# Patient Record
Sex: Female | Born: 1965 | Race: Black or African American | Hispanic: No | Marital: Single | Smoking: Current every day smoker
Health system: Southern US, Community
[De-identification: ages and names within clinical notes are randomized; demographics above are authoritative.]

## PROBLEM LIST (undated history)

## (undated) DIAGNOSIS — I10 Essential (primary) hypertension: Secondary | ICD-10-CM

---

## 2009-10-30 ENCOUNTER — Emergency Department: Payer: Self-pay | Admitting: Emergency Medicine

## 2010-02-04 ENCOUNTER — Emergency Department: Payer: Self-pay | Admitting: Internal Medicine

## 2010-04-29 ENCOUNTER — Emergency Department: Payer: Self-pay | Admitting: Internal Medicine

## 2010-05-06 ENCOUNTER — Emergency Department: Payer: Self-pay | Admitting: Internal Medicine

## 2010-06-19 ENCOUNTER — Emergency Department: Payer: Self-pay | Admitting: Emergency Medicine

## 2011-08-05 ENCOUNTER — Emergency Department: Payer: Self-pay | Admitting: Internal Medicine

## 2012-05-19 ENCOUNTER — Emergency Department: Payer: Self-pay | Admitting: *Deleted

## 2012-05-19 LAB — URINALYSIS, COMPLETE
Glucose,UR: NEGATIVE mg/dL (ref 0–75)
Nitrite: NEGATIVE
Protein: NEGATIVE
Specific Gravity: 1.027 (ref 1.003–1.030)
Squamous Epithelial: 12
WBC UR: 1 /HPF (ref 0–5)

## 2012-05-19 LAB — WET PREP, GENITAL

## 2012-05-19 LAB — PREGNANCY, URINE: Pregnancy Test, Urine: NEGATIVE m[IU]/mL

## 2013-02-17 ENCOUNTER — Emergency Department: Payer: Self-pay | Admitting: Emergency Medicine

## 2014-01-20 ENCOUNTER — Emergency Department: Payer: Self-pay | Admitting: Emergency Medicine

## 2014-09-10 ENCOUNTER — Emergency Department: Payer: Self-pay | Admitting: Emergency Medicine

## 2014-11-22 ENCOUNTER — Emergency Department: Payer: Self-pay | Admitting: Internal Medicine

## 2015-02-19 ENCOUNTER — Emergency Department: Payer: Self-pay | Admitting: Student

## 2015-12-12 ENCOUNTER — Emergency Department: Payer: Self-pay

## 2015-12-12 ENCOUNTER — Emergency Department
Admission: EM | Admit: 2015-12-12 | Discharge: 2015-12-12 | Disposition: A | Discharge status: Home / Self Care | Payer: Self-pay | Attending: Emergency Medicine | Admitting: Emergency Medicine

## 2015-12-12 ENCOUNTER — Encounter: Payer: Self-pay | Admitting: Emergency Medicine

## 2015-12-12 DIAGNOSIS — W11XXXA Fall on and from ladder, initial encounter: Secondary | ICD-10-CM | POA: Insufficient documentation

## 2015-12-12 DIAGNOSIS — Y9389 Activity, other specified: Secondary | ICD-10-CM | POA: Insufficient documentation

## 2015-12-12 DIAGNOSIS — M545 Low back pain, unspecified: Secondary | ICD-10-CM

## 2015-12-12 DIAGNOSIS — Y9289 Other specified places as the place of occurrence of the external cause: Secondary | ICD-10-CM | POA: Insufficient documentation

## 2015-12-12 DIAGNOSIS — S3992XA Unspecified injury of lower back, initial encounter: Secondary | ICD-10-CM | POA: Insufficient documentation

## 2015-12-12 DIAGNOSIS — S3991XA Unspecified injury of abdomen, initial encounter: Secondary | ICD-10-CM | POA: Insufficient documentation

## 2015-12-12 DIAGNOSIS — S37019A Minor contusion of unspecified kidney, initial encounter: Secondary | ICD-10-CM | POA: Insufficient documentation

## 2015-12-12 DIAGNOSIS — Y998 Other external cause status: Secondary | ICD-10-CM | POA: Insufficient documentation

## 2015-12-12 LAB — URINALYSIS COMPLETE WITH MICROSCOPIC (ARMC ONLY)
Bilirubin Urine: NEGATIVE
Glucose, UA: NEGATIVE mg/dL
Hgb urine dipstick: NEGATIVE
KETONES UR: NEGATIVE mg/dL
NITRITE: NEGATIVE
Protein, ur: 30 mg/dL — AB
SPECIFIC GRAVITY, URINE: 1.023 (ref 1.005–1.030)
pH: 7 (ref 5.0–8.0)

## 2015-12-12 MED ORDER — OXYCODONE-ACETAMINOPHEN 5-325 MG PO TABS
1.0000 | ORAL_TABLET | Freq: Once | ORAL | Status: AC
  Administered 2015-12-12: 1 via ORAL
  Filled 2015-12-12: qty 1

## 2015-12-12 MED ORDER — MELOXICAM 15 MG PO TABS: 15.0000 mg | ORAL_TABLET | Freq: Every day | ORAL | Status: DC

## 2015-12-12 NOTE — ED Notes (Signed)
Patient presents to the ED with lower back pain [facility]  Patient states she was standing on a small ladder and her grandchild pulled her down.  Patient ambulatory to registration desk.  Patient reports vaginal spotting 1 x this morning but denies any more vaginal bleeding today.

## 2015-12-12 NOTE — ED Provider Notes (Signed)
[facility] Emergency Department Provider Note  ____________________________________________  Time seen: Approximately 3:13 PM  I have reviewed the triage vital signs and the nursing notes.   HISTORY  Chief Complaint Back Pain    HPI Tracey Fisher is a 49 y.o. female who presents emergency department complaining of severe lower back pain. She states that she was on a step stool yesterday when her grandchild accidentally pulled on her causing her to fall. She states that she fell landing on her buttocks and lower back. She states she initially had significant pain to area but "walked it off." She states this morning she woke up and had increase in her pain. She also endorses significant hematuria this morning. Patient states that she is not having radicular symptoms, did not hit her head or lose consciousness, no chest pain or shortness of breath. States that her hematuria was enough to turn the water red.     History reviewed. No pertinent past medical history.  There are no active problems to display for this patient.   History reviewed. No pertinent past surgical history.  Current Outpatient Rx  Name  Route  Sig  Dispense  Refill  . meloxicam (MOBIC) 15 MG tablet   Oral   Take 1 tablet (15 mg total) by mouth daily.   30 tablet   0     Allergies Review of patient's allergies indicates not on file.  No family history on file.  Social History Social History  Substance Use Topics  . Smoking status: None  . Smokeless tobacco: None  . Alcohol Use: None    Review of Systems Constitutional: No fever/chills Eyes: No visual changes. ENT: No sore throat. Cardiovascular: Denies chest pain. Respiratory: Denies shortness of breath. Gastrointestinal: No abdominal pain.  No nausea, no vomiting.  No diarrhea.  No constipation. Genitourinary: Negative for dysuria.  endorses hematuria.  Musculoskeletal: Negative for back pain. endorses lower back  pain.  Skin: Negative for rash. Neurological: Negative for headaches, focal weakness or numbness.  10-point ROS otherwise negative.  ____________________________________________   PHYSICAL EXAM:  VITAL SIGNS: ED Triage Vitals  Enc Vitals Group     BP 12/12/15 1417 135/94 mmHg     Pulse Rate 12/12/15 1417 85     Resp 12/12/15 1417 20     Temp 12/12/15 1417 98.3 F (36.8 C)     Temp Source 12/12/15 1417 Oral     SpO2 12/12/15 1417 100 %     Weight 12/12/15 1417 125 lb (56.7 kg)     Height 12/12/15 1417  (1.753 m)     Head Cir --      Peak Flow --      Pain Score 12/12/15 1417 9     Pain Loc --      Pain Edu? --      Excl. in GC? --     Constitutional: Alert and oriented. Well appearing and in no acute distress. Eyes: Conjunctivae are normal. PERRL. EOMI. Head: Atraumatic. Nose: No congestion/rhinnorhea. Mouth/Throat: Mucous membranes are moist.  Oropharynx non-erythematous. Neck: No stridor.  No cervical spine tenderness to palpation. Cardiovascular: Normal rate, regular rhythm. Grossly normal heart sounds.  Good peripheral circulation. Respiratory: Normal respiratory effort.  No retractions. Lungs CTAB. Gastrointestinal: Soft and nontender. No distention. No abdominal bruits. Positive CVA tenderness. Musculoskeletal: No lower extremity tenderness nor edema.  No joint effusions. No visible deformity to the spine upon inspection. There is no ecchymosis or contusion noted. Patient is  extremely tender to palpation over midline spinal processes in the lumbar region. No palpable abnormality. Neurologic:  Normal speech and language. No gross focal neurologic deficits are appreciated. No gait instability. Skin:  Skin is warm, dry and intact. No rash noted. Psychiatric: Mood and affect are normal. Speech and behavior are normal.  ____________________________________________   LABS (all labs ordered are listed, but only abnormal results are displayed)  Labs Reviewed   URINALYSIS COMPLETEWITH MICROSCOPIC ([facility] ONLY) - Abnormal; Notable for the following:    Color, Urine YELLOW (*)    APPearance TURBID (*)    Protein, ur 30 (*)    Leukocytes, UA TRACE (*)    Bacteria, UA RARE (*)    Squamous Epithelial / LPF 6-30 (*)    All other components within normal limits   ____________________________________________  EKG   ____________________________________________  RADIOLOGY  Lumbar spine x-ray Impression: Mild degenerative change within the mid lower lumbar spine. Stable compared to previous exam. No acute findings.   I personally reviewed the images. ____________________________________________   PROCEDURES  Procedure(s) performed: None  Critical Care performed: No  ____________________________________________   INITIAL IMPRESSION / ASSESSMENT AND PLAN / ED COURSE  Pertinent labs & imaging results that were available during my care of the patient were reviewed by me and considered in my medical decision making (see chart for details).  Patient's diagnosis consistent with lumbago secondary to a fall. No sciatica. Patient also had some reported hematuria. Urinalysis returns with no signs of red blood cells. Patient likely had a mild renal contusion. Patient will be discharged home with strict ED precautions to return for worsening of symptoms. Otherwise patient will follow-up with primary care. Patient verbalizes understanding of her diagnosis and treatment plan and verbalizes compliance with same.   New Prescriptions   MELOXICAM (MOBIC) 15 MG TABLET    Take 1 tablet (15 mg total) by mouth daily.    ____________________________________________   FINAL CLINICAL IMPRESSION(S) / ED DIAGNOSES  Final diagnoses:  Acute midline low back pain without sciatica  Renal contusion, unspecified laterality, initial encounter      Racheal PatchesJonathan D Hutch Rhett, PA-C 12/12/15 1840  Arnaldo NatalPaul F Malinda, MD 12/12/15 2325

## 2015-12-12 NOTE — Discharge Instructions (Signed)
Back Exercises °The following exercises strengthen the muscles that help to support the back. They also help to keep the lower back flexible. Doing these exercises can help to prevent back pain or lessen existing pain. °If you have back pain or discomfort, try doing these exercises 2-3 times each day or as told by your health care provider. When the pain goes away, do them once each day, but increase the number of times that you repeat the steps for each exercise (do more repetitions). If you do not have back pain or discomfort, do these exercises once each day or as told by your health care provider. °EXERCISES °Single Knee to Chest °Repeat these steps 3-5 times for each leg: °· Lie on your back on a firm bed or the floor with your legs extended. °· Bring one knee to your chest. Your other leg should stay extended and in contact with the floor. °· Hold your knee in place by grabbing your knee or thigh. °· Pull on your knee until you feel a gentle stretch in your lower back. °· Hold the stretch for 10-30 seconds. °· Slowly release and straighten your leg. °Pelvic Tilt °Repeat these steps 5-10 times: °· Lie on your back on a firm bed or the floor with your legs extended. °· Bend your knees so they are pointing toward the ceiling and your feet are flat on the floor. °· Tighten your lower abdominal muscles to press your lower back against the floor. This motion will tilt your pelvis so your tailbone points up toward the ceiling instead of pointing to your feet or the floor. °· With gentle tension and even breathing, hold this position for 5-10 seconds. °Cat-Cow °Repeat these steps until your lower back becomes more flexible: °· Get into a hands-and-knees position on a firm surface. Keep your hands under your shoulders, and keep your knees under your hips. You may place padding under your knees for comfort. °· Let your head hang down, and point your tailbone toward the floor so your lower back becomes rounded like the  back of a cat. °· Hold this position for 5 seconds. °· Slowly lift your head and point your tailbone up toward the ceiling so your back forms a sagging arch like the back of a cow. °· Hold this position for 5 seconds. °Press-Ups °Repeat these steps 5-10 times: °· Lie on your abdomen (face-down) on the floor. °· Place your palms near your head, about shoulder-width apart. °· While you keep your back as relaxed as possible and keep your hips on the floor, slowly straighten your arms to raise the top half of your body and lift your shoulders. Do not use your back muscles to raise your upper torso. You may adjust the placement of your hands to make yourself more comfortable. °· Hold this position for 5 seconds while you keep your back relaxed. °· Slowly return to lying flat on the floor. °Bridges °Repeat these steps 10 times: °1. Lie on your back on a firm surface. °2. Bend your knees so they are pointing toward the ceiling and your feet are flat on the floor. °3. Tighten your buttocks muscles and lift your buttocks off of the floor until your waist is at almost the same height as your knees. You should feel the muscles working in your buttocks and the back of your thighs. If you do not feel these muscles, slide your feet 1-2 inches farther away from your buttocks. °4. Hold this position for 3-5   seconds. °5. Slowly lower your hips to the starting position, and allow your buttocks muscles to relax completely. °If this exercise is too easy, try doing it with your arms crossed over your chest. °Abdominal Crunches °Repeat these steps 5-10 times: °1. Lie on your back on a firm bed or the floor with your legs extended. °2. Bend your knees so they are pointing toward the ceiling and your feet are flat on the floor. °3. Cross your arms over your chest. °4. Tip your chin slightly toward your chest without bending your neck. °5. Tighten your abdominal muscles and slowly raise your trunk (torso) high enough to lift your shoulder  blades a tiny bit off of the floor. Avoid raising your torso higher than that, because it can put too much stress on your low back and it does not help to strengthen your abdominal muscles. °6. Slowly return to your starting position. °Back Lifts °Repeat these steps 5-10 times: °1. Lie on your abdomen (face-down) with your arms at your sides, and rest your forehead on the floor. °2. Tighten the muscles in your legs and your buttocks. °3. Slowly lift your chest off of the floor while you keep your hips pressed to the floor. Keep the back of your head in line with the curve in your back. Your eyes should be looking at the floor. °4. Hold this position for 3-5 seconds. °5. Slowly return to your starting position. °SEEK MEDICAL CARE IF: °· Your back pain or discomfort gets much worse when you do an exercise. °· Your back pain or discomfort does not lessen within 2 hours after you exercise. °If you have any of these problems, stop doing these exercises right away. Do not do them again unless your health care provider says that you can. °SEEK IMMEDIATE MEDICAL CARE IF: °· You develop sudden, severe back pain. If this happens, stop doing the exercises right away. Do not do them again unless your health care provider says that you can. °  °This information is not intended to replace advice given to you by your health care provider. Make sure you discuss any questions you have with your health care provider. °  °Document Released: 01/18/2005 Document Revised: 09/01/2015 Document Reviewed: 02/04/2015 °Elsevier Interactive Patient Education ©2016 [employer] ° °Back Pain, Adult °Back pain is very common in adults. The cause of back pain is rarely dangerous and the pain often gets better over time. The cause of your back pain may not be known. Some common causes of back pain include: °· Strain of the muscles or ligaments supporting the spine. °· Wear and tear (degeneration) of the spinal disks. °· Arthritis. °· Direct injury  to the back. °For many people, back pain may return. Since back pain is rarely dangerous, most people can learn to manage this condition on their own. °HOME CARE INSTRUCTIONS °Watch your back pain for any changes. The following actions may help to lessen any discomfort you are feeling: °· Remain active. It is stressful on your back to sit or stand in one place for long periods of time. Do not sit, drive, or stand in one place for more than 30 minutes at a time. Take short walks on even surfaces as soon as you are able. Try to increase the length of time you walk each day. °· Exercise regularly as directed by your health care provider. Exercise helps your back heal faster. It also helps avoid future injury by keeping your muscles strong and flexible. °· Do not stay in   bed. Resting more than 1-2 days can delay your recovery. °· Pay attention to your body when you bend and lift. The most comfortable positions are those that put less stress on your recovering back. Always use proper lifting techniques, including: °¨ Bending your knees. °¨ Keeping the load close to your body. °¨ Avoiding twisting. °· Find a comfortable position to sleep. Use a firm mattress and lie on your side with your knees slightly bent. If you lie on your back, put a pillow under your knees. °· Avoid feeling anxious or stressed. Stress increases muscle tension and can worsen back pain. It is important to recognize when you are anxious or stressed and learn ways to manage it, such as with exercise. °· Take medicines only as directed by your health care provider. Over-the-counter medicines to reduce pain and inflammation are often the most helpful. Your health care provider may prescribe muscle relaxant drugs. These medicines help dull your pain so you can more quickly return to your normal activities and healthy exercise. °· Apply ice to the injured area: °¨ Put ice in a plastic bag. °¨ Place a towel between your skin and the bag. °¨ Leave the ice on  for 20 minutes, 2-3 times a day for the first 2-3 days. After that, ice and heat may be alternated to reduce pain and spasms. °· Maintain a healthy weight. Excess weight puts extra stress on your back and makes it difficult to maintain good posture. °SEEK MEDICAL CARE IF: °· You have pain that is not relieved with rest or medicine. °· You have increasing pain going down into the legs or buttocks. °· You have pain that does not improve in one week. °· You have night pain. °· You lose weight. °· You have a fever or chills. °SEEK IMMEDIATE MEDICAL CARE IF:  °· You develop new bowel or bladder control problems. °· You have unusual weakness or numbness in your arms or legs. °· You develop nausea or vomiting. °· You develop abdominal pain. °· You feel faint. °  °This information is not intended to replace advice given to you by your health care provider. Make sure you discuss any questions you have with your health care provider. °  °Document Released: 12/11/2005 Document Revised: 01/01/2015 Document Reviewed: 04/14/2014 °Elsevier Interactive Patient Education ©2016 [employer] ° °

## 2015-12-12 NOTE — ED Notes (Signed)
Pt discharged home after verbalizing understanding of discharge instructions; nad noted. 

## 2015-12-12 NOTE — ED Notes (Signed)
Pt reports that she fell yesterday and hurt her back. She said it didn't really hurt until this morning, and she woke up in pain. She also had enough blood in her urine to turn the water red, but has not had any then. Pt alert & oriented with NAD noted.

## 2016-06-14 ENCOUNTER — Emergency Department
Admission: EM | Admit: 2016-06-14 | Discharge: 2016-06-14 | Disposition: A | Discharge status: Home / Self Care | Payer: Self-pay | Attending: Emergency Medicine | Admitting: Emergency Medicine

## 2016-06-14 ENCOUNTER — Encounter: Payer: Self-pay | Admitting: Emergency Medicine

## 2016-06-14 DIAGNOSIS — Z791 Long term (current) use of non-steroidal anti-inflammatories (NSAID): Secondary | ICD-10-CM | POA: Insufficient documentation

## 2016-06-14 DIAGNOSIS — F17200 Nicotine dependence, unspecified, uncomplicated: Secondary | ICD-10-CM | POA: Insufficient documentation

## 2016-06-14 DIAGNOSIS — N3001 Acute cystitis with hematuria: Secondary | ICD-10-CM | POA: Insufficient documentation

## 2016-06-14 LAB — URINALYSIS COMPLETE WITH MICROSCOPIC (ARMC ONLY)
BILIRUBIN URINE: NEGATIVE
Glucose, UA: NEGATIVE mg/dL
Nitrite: NEGATIVE
PH: 6 (ref 5.0–8.0)
Protein, ur: NEGATIVE mg/dL
SPECIFIC GRAVITY, URINE: 1.017 (ref 1.005–1.030)

## 2016-06-14 LAB — POCT PREGNANCY, URINE: Preg Test, Ur: NEGATIVE

## 2016-06-14 MED ORDER — PHENAZOPYRIDINE HCL 200 MG PO TABS: 200.0000 mg | ORAL_TABLET | Freq: Three times a day (TID) | ORAL | Status: AC | PRN

## 2016-06-14 MED ORDER — SULFAMETHOXAZOLE-TRIMETHOPRIM 800-160 MG PO TABS: 1.0000 | ORAL_TABLET | Freq: Two times a day (BID) | ORAL | Status: DC

## 2016-06-14 NOTE — ED Notes (Signed)
NAD noted at time of D/C. Pt denies questions or concerns. Pt ambulatory to the lobby at this time.  

## 2016-06-14 NOTE — Discharge Instructions (Signed)

## 2016-06-14 NOTE — ED Notes (Signed)
Pt to ed with c/o bloody urine and back pain x 2 days.

## 2016-06-14 NOTE — ED Provider Notes (Signed)
[facility] Emergency Department Provider Note  ____________________________________________  Time seen: Approximately 11:07 AM  I have reviewed the triage vital signs and the nursing notes.   HISTORY  Chief Complaint Hematuria and Back Pain    HPI Tracey Fisher is a 50 y.o. female who arrives to the emergency department with complaint of "pulling sensation" when peeing x 1-2 days and hematuria. Says suprapubic pulling sensation only occurs during urination and with radiating right mid-back and flank pain. States pain is an 8-9/10 and has not been treated with any pharmacologic or non-pharmacologic management. Patients states that when peeing she noticed a trace amount of blood in her urine. She admits to subjective fevers and night sweats last night, with increased frequency of urination, hematuria, and dark, cloudy, malodorous urine. She denies history of nephrolithiasis, vaginal discharge, vaginal bleeding, nausea, vomiting, and diarrhea.   History reviewed. No pertinent past medical history.  There are no active problems to display for this patient.   History reviewed. No pertinent past surgical history.  Current Outpatient Rx  Name  Route  Sig  Dispense  Refill  . meloxicam (MOBIC) 15 MG tablet   Oral   Take 1 tablet (15 mg total) by mouth daily.   30 tablet   0   . phenazopyridine (PYRIDIUM) 200 MG tablet   Oral   Take 1 tablet (200 mg total) by mouth 3 (three) times daily as needed for pain.   20 tablet   0   . sulfamethoxazole-trimethoprim (BACTRIM DS,SEPTRA DS) 800-160 MG tablet   Oral   Take 1 tablet by mouth 2 (two) times daily.   6 tablet   0     Allergies Naproxen and Tramadol  History reviewed. No pertinent family history.  Social History Social History  Substance Use Topics  . Smoking status: Current Every Day Smoker  . Smokeless tobacco: None  . Alcohol Use: No    Review of Systems Constitutional: Negative for  fever. Respiratory: Negative for shortness of breath or cough. Gastrointestinal: Positive for abdominal pain; negative for nausea , negative for vomiting. Genitourinary: Positive for dysuria , negative for vaginal discharge. Musculoskeletal: Positive for back pain. Skin: Negative for wound, rash, or lesion. ____________________________________________   PHYSICAL EXAM:  VITAL SIGNS: ED Triage Vitals  Enc Vitals Group     BP 06/14/16 1047 118/82 mmHg     Pulse Rate 06/14/16 1047 95     Resp 06/14/16 1047 20     Temp 06/14/16 1047 98.7 F (37.1 C)     Temp Source 06/14/16 1047 Oral     SpO2 06/14/16 1047 100 %     Weight 06/14/16 1047 125 lb (56.7 kg)     Height 06/14/16 1047  (1.448 m)     Head Cir --      Peak Flow --      Pain Score 06/14/16 1048 8     Pain Loc --      Pain Edu? --      Excl. in GC? --     Constitutional: Alert and oriented. Well appearing and in no acute distress. Eyes: Conjunctivae are normal. PERRL. EOMI. Head: Atraumatic. Nose: No congestion/rhinnorhea. Mouth/Throat: Mucous membranes are moist. Respiratory: Normal respiratory effort.  No retractions. Gastrointestinal: Suprapubic tenderness on palpation, otherwise soft and non-tender without distension, rigidity, or rebound tenderness. Genitourinary: Pelvic exam: deferred Musculoskeletal: No extremity tenderness nor edema. Mild CVA tenderness on the right. Neurologic:  Normal speech and language. No gross focal  neurologic deficits are appreciated. Speech is normal. No gait instability. Skin:  Skin is warm, dry and intact. No rash noted. Psychiatric: Mood and affect are normal. Speech and behavior are normal.  ____________________________________________   LABS (all labs ordered are listed, but only abnormal results are displayed)  Labs Reviewed  URINALYSIS COMPLETEWITH MICROSCOPIC ([facility] ONLY) - Abnormal; Notable for the following:    Color, Urine YELLOW (*)    APPearance HAZY (*)     Ketones, ur TRACE (*)    Hgb urine dipstick 2+ (*)    Leukocytes, UA TRACE (*)    Bacteria, UA RARE (*)    Squamous Epithelial / LPF 6-30 (*)    All other components within normal limits  POC URINE PREG, ED  POCT PREGNANCY, URINE   ____________________________________________  RADIOLOGY  Not indicated. ____________________________________________   PROCEDURES  Procedure(s) performed: None  ____________________________________________   INITIAL IMPRESSION / ASSESSMENT AND PLAN / ED COURSE  Pertinent labs & imaging results that were available during my care of the patient were reviewed by me and considered in my medical decision making (see chart for details).  Differential diagnosis could be nephrolithiasis, however at this time clinical exam makes it less likely. Will treat with 3 days of antibiotic therapy.  Patient will be given prescriptions for Bactrim and pyridium today. She was advised to follow up with the health department for symptoms that are not improving over the next few days. She was advised to return to the ER for symptoms that change or worsen if unable to schedule an appointment. ____________________________________________   FINAL CLINICAL IMPRESSION(S) / ED DIAGNOSES  Final diagnoses:  Acute cystitis with hematuria    Note:  This document was prepared using Dragon voice recognition software and may include unintentional dictation errors.   Chinita PesterCari B Bryon Parker, FNP 06/14/16 1245  Minna AntisKevin Paduchowski, MD 06/15/16 2006

## 2016-09-14 ENCOUNTER — Emergency Department
Admission: EM | Admit: 2016-09-14 | Discharge: 2016-09-14 | Disposition: A | Discharge status: Home / Self Care | Payer: Self-pay | Attending: Emergency Medicine | Admitting: Emergency Medicine

## 2016-09-14 DIAGNOSIS — L729 Follicular cyst of the skin and subcutaneous tissue, unspecified: Secondary | ICD-10-CM | POA: Insufficient documentation

## 2016-09-14 DIAGNOSIS — F17200 Nicotine dependence, unspecified, uncomplicated: Secondary | ICD-10-CM | POA: Insufficient documentation

## 2016-09-14 DIAGNOSIS — L089 Local infection of the skin and subcutaneous tissue, unspecified: Secondary | ICD-10-CM

## 2016-09-14 DIAGNOSIS — Z79899 Other long term (current) drug therapy: Secondary | ICD-10-CM | POA: Insufficient documentation

## 2016-09-14 MED ORDER — CEPHALEXIN 500 MG PO CAPS: 500.0000 mg | ORAL_CAPSULE | Freq: Four times a day (QID) | ORAL | 0 refills | Status: DC
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
## 2016-09-14 NOTE — ED Triage Notes (Signed)
Pt presents to ED with c/o a boil on her chest for "years". Pt reports she "can smell it", but denies any drainage from the site. Pt denies any N/V/D or fever; denies SHOB or CP.

## 2016-09-14 NOTE — ED Notes (Signed)
Pt in via triage with complaints of recurrent cyst to left chest x "years."  Pt reports new pain x 2 weeks with an odor coming from cyst, pt denies any discharge.  Pt A/Ox4, no immediate distress noted.

## 2017-02-05 ENCOUNTER — Encounter: Payer: Self-pay | Admitting: Emergency Medicine

## 2017-02-05 ENCOUNTER — Emergency Department
Admission: EM | Admit: 2017-02-05 | Discharge: 2017-02-05 | Disposition: A | Discharge status: Home / Self Care | Payer: Self-pay | Attending: Emergency Medicine | Admitting: Emergency Medicine

## 2017-02-05 DIAGNOSIS — S61012A Laceration without foreign body of left thumb without damage to nail, initial encounter: Secondary | ICD-10-CM | POA: Insufficient documentation

## 2017-02-05 DIAGNOSIS — Y939 Activity, unspecified: Secondary | ICD-10-CM | POA: Insufficient documentation

## 2017-02-05 DIAGNOSIS — W268XXA Contact with other sharp object(s), not elsewhere classified, initial encounter: Secondary | ICD-10-CM | POA: Insufficient documentation

## 2017-02-05 DIAGNOSIS — Y999 Unspecified external cause status: Secondary | ICD-10-CM | POA: Insufficient documentation

## 2017-02-05 DIAGNOSIS — Y929 Unspecified place or not applicable: Secondary | ICD-10-CM | POA: Insufficient documentation

## 2017-02-05 DIAGNOSIS — F17210 Nicotine dependence, cigarettes, uncomplicated: Secondary | ICD-10-CM | POA: Insufficient documentation

## 2017-02-05 NOTE — ED Triage Notes (Signed)
Cut thumb L hand on porcelain yesterday.

## 2017-02-05 NOTE — Discharge Instructions (Signed)
Keep the wound clean, dry, and covered as needed. Avoid using creams, oils, ointment, or lotions to the glued wound.

## 2017-02-05 NOTE — ED Provider Notes (Signed)
[facility] Emergency Department Provider Note ____________________________________________  Time seen: 1325  I have reviewed the triage vital signs and the nursing notes.  HISTORY  Chief Complaint  Laceration  HPI Tracey Fisher is a 51 y.o. female presents to the ED for evaluation of a laceration to her left thumb. She reports she accidentally cut her thumb on a porcelain figurine yesterday afternoon. She initially dressed the thumb and was content. She reports now due to increased bleeding and the wound opening up at the joint crease.   History reviewed. No pertinent past medical history.  There are no active problems to display for this patient.  History reviewed. No pertinent surgical history.  Prior to Admission medications   Medication Sig Start Date End Date Taking? Authorizing Provider  cephALEXin (KEFLEX) 500 MG capsule Take 1 capsule (500 mg total) by mouth 4 (four) times daily. 09/14/16   Delorise RoyalsJonathan D Cuthriell, PA-C  meloxicam (MOBIC) 15 MG tablet Take 1 tablet (15 mg total) by mouth daily. 12/12/15   Delorise RoyalsJonathan D Cuthriell, PA-C  metroNIDAZOLE (FLAGYL) 500 MG tablet Take 500 mg by mouth 2 (two) times daily.    Historical Provider, MD  phenazopyridine (PYRIDIUM) 200 MG tablet Take 1 tablet (200 mg total) by mouth 3 (three) times daily as needed for pain. 06/14/16 06/14/17  Chinita Pesterari B Triplett, FNP  sulfamethoxazole-trimethoprim (BACTRIM DS,SEPTRA DS) 800-160 MG tablet Take 1 tablet by mouth 2 (two) times daily. 06/14/16   Chinita Pesterari B Triplett, FNP    Allergies Naproxen and Tramadol  No family history on file.  Social History Social History  Substance Use Topics  . Smoking status: Current Every Day Smoker    Packs/day: 0.10    Types: Cigarettes  . Smokeless tobacco: Never Used  . Alcohol use No    Review of Systems  Constitutional: Negative for fever. Cardiovascular: Negative for chest pain. Respiratory: Negative for shortness of  breath. Musculoskeletal: Negative for back pain. Skin: Negative for rash. Left thumb lac as above.  Neurological: Negative for headaches, focal weakness or numbness. ____________________________________________  PHYSICAL EXAM:  VITAL SIGNS: ED Triage Vitals  Enc Vitals Group     BP 02/05/17 1255 (!) 149/86     Pulse Rate 02/05/17 1255 (!) 57     Resp 02/05/17 1255 18     Temp 02/05/17 1255 98.2 F (36.8 C)     Temp Source 02/05/17 1255 Oral     SpO2 02/05/17 1255 100 %     Weight 02/05/17 1256 135 lb (61.2 kg)     Height 02/05/17 1256 4\' 9"  (1.448 m)     Head Circumference --      Peak Flow --      Pain Score 02/05/17 1256 8     Pain Loc --      Pain Edu? --      Excl. in GC? --    Constitutional: Alert and oriented. Well appearing and in no distress. Head: Normocephalic and atraumatic. Cardiovascular: Normal rate, regular rhythm. Normal distal pulses. Respiratory: Normal respiratory effort.  Musculoskeletal: Normal composite fist to the left hand. Nontender with normal range of motion in all extremities.  Neurologic:  Normal gross sensation. Normal intrinsic and opposition testing. No gross focal neurologic deficits are appreciated. Skin:  Skin is warm, dry and intact. Left thumb with a superficial, linear laceration across the DIP flexor surface.  ____________________________________________  PROCEDURES  LACERATION REPAIR Performed by: Lissa HoardMenshew, Crixus Mcaulay V Bacon Authorized by: Lissa HoardMenshew, Iysha Mishkin V Bacon Consent: Verbal  consent obtained. Risks and benefits: risks, benefits and alternatives were discussed Consent given by: patient Patient identity confirmed: provided demographic data Prepped and Draped in normal sterile fashion Wound explored  Laceration Location: left thumb  Laceration Length: 1 cm  No Foreign Bodies seen or palpated  Irrigation method: gauze  Amount of cleaning: standard  Skin closure: cyanoacrylate wound adhesive  Patient tolerance: Patient  tolerated the procedure well with no immediate complications. ___________________________________________  INITIAL IMPRESSION / ASSESSMENT AND PLAN / ED COURSE  Patient with a superficial laceration to the left thumb at the flexor surface of the DIP, greater than 18 hours duration. The wound is without signs of acute superficial infection. There is wound dehiscence due to mechanics of the thumb joint. The patient's wound is cleansed and the wound is closed with the application of wound adhesive. Wound care instructions are provided to the patient. She will follow up with her primary care provider or return to the ED as needed. ____________________________________________  FINAL CLINICAL IMPRESSION(S) / ED DIAGNOSES  Final diagnoses:  Laceration of left thumb without foreign body without damage to nail, initial encounter      Lissa Hoard, PA-C 02/05/17 1537    Rockne Menghini, MD 02/05/17 1542

## 2017-02-05 NOTE — ED Notes (Signed)
See triage note  States she cut her left thumb yesterday   Small laceration noted states area is still bleeding

## 2017-06-16 ENCOUNTER — Emergency Department: Payer: Self-pay

## 2017-06-16 ENCOUNTER — Encounter: Payer: Self-pay | Admitting: Emergency Medicine

## 2017-06-16 ENCOUNTER — Emergency Department
Admission: EM | Admit: 2017-06-16 | Discharge: 2017-06-16 | Disposition: A | Discharge status: Home / Self Care | Payer: Self-pay | Attending: Emergency Medicine | Admitting: Emergency Medicine

## 2017-06-16 DIAGNOSIS — M545 Low back pain, unspecified: Secondary | ICD-10-CM

## 2017-06-16 DIAGNOSIS — Z79899 Other long term (current) drug therapy: Secondary | ICD-10-CM | POA: Insufficient documentation

## 2017-06-16 DIAGNOSIS — F17210 Nicotine dependence, cigarettes, uncomplicated: Secondary | ICD-10-CM | POA: Insufficient documentation

## 2017-06-16 DIAGNOSIS — Y939 Activity, unspecified: Secondary | ICD-10-CM | POA: Insufficient documentation

## 2017-06-16 DIAGNOSIS — Y999 Unspecified external cause status: Secondary | ICD-10-CM | POA: Insufficient documentation

## 2017-06-16 DIAGNOSIS — Y929 Unspecified place or not applicable: Secondary | ICD-10-CM | POA: Insufficient documentation

## 2017-06-16 DIAGNOSIS — M25511 Pain in right shoulder: Secondary | ICD-10-CM | POA: Insufficient documentation

## 2017-06-16 LAB — POCT PREGNANCY, URINE: PREG TEST UR: NEGATIVE

## 2017-06-16 MED ORDER — CYCLOBENZAPRINE HCL 10 MG PO TABS: 10.0000 mg | ORAL_TABLET | Freq: Three times a day (TID) | ORAL | 0 refills | Status: DC | PRN

## 2017-06-16 MED ORDER — OXYCODONE-ACETAMINOPHEN 7.5-325 MG PO TABS: 1.0000 | ORAL_TABLET | Freq: Four times a day (QID) | ORAL | 0 refills | Status: DC | PRN

## 2017-06-16 MED ORDER — KETOROLAC TROMETHAMINE 60 MG/2ML IM SOLN
60.0000 mg | Freq: Once | INTRAMUSCULAR | Status: AC
  Administered 2017-06-16: 60 mg via INTRAMUSCULAR
  Filled 2017-06-16: qty 2

## 2017-06-16 MED ORDER — BACITRACIN ZINC 500 UNIT/GM EX OINT
TOPICAL_OINTMENT | Freq: Once | CUTANEOUS | Status: AC
Start: 2017-06-16 — End: 2017-06-16
  Administered 2017-06-16: 1 via TOPICAL
  Filled 2017-06-16: qty 0.9
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
## 2017-06-16 NOTE — ED Notes (Signed)
BPO at bedside
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
## 2017-06-16 NOTE — Discharge Instructions (Signed)
Arm sling for 3-5 days as needed. °

## 2017-06-16 NOTE — ED Notes (Signed)
Pt verbalizes understanding of discharge instructions.
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
## 2018-09-14 ENCOUNTER — Other Ambulatory Visit: Payer: Self-pay

## 2018-09-14 ENCOUNTER — Encounter: Payer: Self-pay | Admitting: Emergency Medicine

## 2018-09-14 ENCOUNTER — Observation Stay
Admission: EM | Admit: 2018-09-14 | Discharge: 2018-09-16 | Disposition: A | Discharge status: Home / Self Care | Payer: Self-pay | Attending: Internal Medicine | Admitting: Internal Medicine

## 2018-09-14 DIAGNOSIS — F1493 Cocaine use, unspecified with withdrawal: Secondary | ICD-10-CM

## 2018-09-14 DIAGNOSIS — Z79899 Other long term (current) drug therapy: Secondary | ICD-10-CM | POA: Insufficient documentation

## 2018-09-14 DIAGNOSIS — F1420 Cocaine dependence, uncomplicated: Secondary | ICD-10-CM

## 2018-09-14 DIAGNOSIS — F1193 Opioid use, unspecified with withdrawal: Secondary | ICD-10-CM

## 2018-09-14 DIAGNOSIS — F1210 Cannabis abuse, uncomplicated: Secondary | ICD-10-CM | POA: Insufficient documentation

## 2018-09-14 DIAGNOSIS — F1020 Alcohol dependence, uncomplicated: Secondary | ICD-10-CM

## 2018-09-14 DIAGNOSIS — F419 Anxiety disorder, unspecified: Secondary | ICD-10-CM | POA: Insufficient documentation

## 2018-09-14 DIAGNOSIS — Z888 Allergy status to other drugs, medicaments and biological substances status: Secondary | ICD-10-CM | POA: Insufficient documentation

## 2018-09-14 DIAGNOSIS — I10 Essential (primary) hypertension: Secondary | ICD-10-CM | POA: Insufficient documentation

## 2018-09-14 DIAGNOSIS — F10230 Alcohol dependence with withdrawal, uncomplicated: Secondary | ICD-10-CM | POA: Insufficient documentation

## 2018-09-14 DIAGNOSIS — F1423 Cocaine dependence with withdrawal: Principal | ICD-10-CM | POA: Insufficient documentation

## 2018-09-14 DIAGNOSIS — F1123 Opioid dependence with withdrawal: Secondary | ICD-10-CM | POA: Insufficient documentation

## 2018-09-14 DIAGNOSIS — F17210 Nicotine dependence, cigarettes, uncomplicated: Secondary | ICD-10-CM | POA: Insufficient documentation

## 2018-09-14 DIAGNOSIS — Z886 Allergy status to analgesic agent status: Secondary | ICD-10-CM | POA: Insufficient documentation

## 2018-09-14 DIAGNOSIS — F10930 Alcohol use, unspecified with withdrawal, uncomplicated: Secondary | ICD-10-CM

## 2018-09-14 DIAGNOSIS — F1120 Opioid dependence, uncomplicated: Secondary | ICD-10-CM

## 2018-09-14 DIAGNOSIS — F19939 Other psychoactive substance use, unspecified with withdrawal, unspecified: Secondary | ICD-10-CM

## 2018-09-14 LAB — CBC WITH DIFFERENTIAL/PLATELET
BASOS PCT: 1 %
Basophils Absolute: 0.1 10*3/uL (ref 0–0.1)
EOS ABS: 0.1 10*3/uL (ref 0–0.7)
Eosinophils Relative: 2 %
HEMATOCRIT: 36.9 % (ref 35.0–47.0)
HEMOGLOBIN: 13 g/dL (ref 12.0–16.0)
LYMPHS ABS: 2 10*3/uL (ref 1.0–3.6)
Lymphocytes Relative: 26 %
MCH: 30.8 pg (ref 26.0–34.0)
MCHC: 35.2 g/dL (ref 32.0–36.0)
MCV: 87.5 fL (ref 80.0–100.0)
Monocytes Absolute: 0.4 10*3/uL (ref 0.2–0.9)
Monocytes Relative: 6 %
NEUTROS ABS: 5.1 10*3/uL (ref 1.4–6.5)
NEUTROS PCT: 65 %
Platelets: 213 10*3/uL (ref 150–440)
RBC: 4.22 MIL/uL (ref 3.80–5.20)
RDW: 13.9 % (ref 11.5–14.5)
WBC: 7.8 10*3/uL (ref 3.6–11.0)

## 2018-09-14 LAB — COMPREHENSIVE METABOLIC PANEL
ALT: 21 U/L (ref 0–44)
ANION GAP: 6 (ref 5–15)
AST: 31 U/L (ref 15–41)
Albumin: 4.1 g/dL (ref 3.5–5.0)
Alkaline Phosphatase: 78 U/L (ref 38–126)
BILIRUBIN TOTAL: 0.3 mg/dL (ref 0.3–1.2)
BUN: 12 mg/dL (ref 6–20)
CHLORIDE: 105 mmol/L (ref 98–111)
CO2: 29 mmol/L (ref 22–32)
Calcium: 9.2 mg/dL (ref 8.9–10.3)
Creatinine, Ser: 0.69 mg/dL (ref 0.44–1.00)
GFR calc Af Amer: >60 mL/min (ref >60)
GFR calc non Af Amer: >60 mL/min (ref >60)
GLUCOSE: 99 mg/dL (ref 70–99)
POTASSIUM: 3.7 mmol/L (ref 3.5–5.1)
Sodium: 140 mmol/L (ref 135–145)
Total Protein: 7.3 g/dL (ref 6.5–8.1)

## 2018-09-14 LAB — URINALYSIS, COMPLETE (UACMP) WITH MICROSCOPIC
BACTERIA UA: NONE SEEN
BILIRUBIN URINE: NEGATIVE
Glucose, UA: NEGATIVE mg/dL
HGB URINE DIPSTICK: NEGATIVE
KETONES UR: NEGATIVE mg/dL
LEUKOCYTES UA: NEGATIVE
NITRITE: NEGATIVE
PROTEIN: NEGATIVE mg/dL
Specific Gravity, Urine: 1.021 (ref 1.005–1.030)
pH: 5 (ref 5.0–8.0)

## 2018-09-14 LAB — URINE DRUG SCREEN, QUALITATIVE (ARMC ONLY)
Amphetamines, Ur Screen: NOT DETECTED
Barbiturates, Ur Screen: NOT DETECTED
Benzodiazepine, Ur Scrn: POSITIVE — AB
COCAINE METABOLITE, UR ~~LOC~~: POSITIVE — AB
Cannabinoid 50 Ng, Ur ~~LOC~~: POSITIVE — AB
MDMA (ECSTASY) UR SCREEN: NOT DETECTED
METHADONE SCREEN, URINE: NOT DETECTED
OPIATE, UR SCREEN: POSITIVE — AB
Phencyclidine (PCP) Ur S: NOT DETECTED
TRICYCLIC, UR SCREEN: NOT DETECTED

## 2018-09-14 LAB — ACETAMINOPHEN LEVEL: Acetaminophen (Tylenol), Serum: <10 ug/mL — ABNORMAL LOW (ref 10–30)

## 2018-09-14 LAB — PREGNANCY, URINE: Preg Test, Ur: NEGATIVE

## 2018-09-14 LAB — ETHANOL

## 2018-09-14 LAB — POCT PREGNANCY, URINE: Preg Test, Ur: NEGATIVE

## 2018-09-14 LAB — SALICYLATE LEVEL: Salicylate Lvl: <7 mg/dL (ref 2.8–30.0)

## 2018-09-14 MED ORDER — LORAZEPAM 2 MG/ML IJ SOLN
2.0000 mg | INTRAMUSCULAR | Status: DC | PRN
  Administered 2018-09-14 – 2018-09-15 (×2): 2 mg via INTRAVENOUS
  Filled 2018-09-14 (×2): qty 1

## 2018-09-14 MED ORDER — ONDANSETRON 4 MG PO TBDP
4.0000 mg | ORAL_TABLET | Freq: Four times a day (QID) | ORAL | Status: DC | PRN
  Administered 2018-09-14: 4 mg via ORAL
  Filled 2018-09-14 (×2): qty 1

## 2018-09-14 MED ORDER — ACETAMINOPHEN 650 MG RE SUPP: 650.0000 mg | Freq: Four times a day (QID) | RECTAL | Status: DC | PRN

## 2018-09-14 MED ORDER — HYDRALAZINE HCL 20 MG/ML IJ SOLN: 10.0000 mg | Freq: Four times a day (QID) | INTRAMUSCULAR | Status: DC | PRN

## 2018-09-14 MED ORDER — HYDROXYZINE HCL 25 MG PO TABS
25.0000 mg | ORAL_TABLET | Freq: Four times a day (QID) | ORAL | Status: DC | PRN
  Administered 2018-09-14: 25 mg via ORAL
  Filled 2018-09-14 (×2): qty 1

## 2018-09-14 MED ORDER — LORAZEPAM 1 MG PO TABS: 1.0000 mg | ORAL_TABLET | Freq: Four times a day (QID) | ORAL | Status: DC | PRN

## 2018-09-14 MED ORDER — SODIUM CHLORIDE 0.9% FLUSH: 3.0000 mL | INTRAVENOUS | Status: DC | PRN

## 2018-09-14 MED ORDER — FOLIC ACID 1 MG PO TABS
1.0000 mg | ORAL_TABLET | Freq: Every day | ORAL | Status: DC
  Administered 2018-09-14 – 2018-09-16 (×3): 1 mg via ORAL
  Filled 2018-09-14 (×5): qty 1

## 2018-09-14 MED ORDER — DICYCLOMINE HCL 20 MG PO TABS
20.0000 mg | ORAL_TABLET | Freq: Four times a day (QID) | ORAL | Status: DC | PRN
Start: 2018-09-14 — End: 2018-09-15
  Administered 2018-09-14: 20 mg via ORAL
  Filled 2018-09-14 (×2): qty 1

## 2018-09-14 MED ORDER — THIAMINE HCL 100 MG/ML IJ SOLN: 100.0000 mg | Freq: Every day | INTRAMUSCULAR | Status: DC

## 2018-09-14 MED ORDER — SODIUM CHLORIDE 0.9% FLUSH
3.0000 mL | Freq: Two times a day (BID) | INTRAVENOUS | Status: DC
  Administered 2018-09-14 – 2018-09-15 (×3): 3 mL via INTRAVENOUS

## 2018-09-14 MED ORDER — ONDANSETRON HCL 4 MG/2ML IJ SOLN: 4.0000 mg | Freq: Four times a day (QID) | INTRAMUSCULAR | Status: DC | PRN

## 2018-09-14 MED ORDER — VITAMIN B-1 100 MG PO TABS
100.0000 mg | ORAL_TABLET | Freq: Every day | ORAL | Status: DC
  Administered 2018-09-15 – 2018-09-16 (×2): 100 mg via ORAL
  Filled 2018-09-14 (×2): qty 1

## 2018-09-14 MED ORDER — MORPHINE SULFATE (PF) 2 MG/ML IV SOLN
2.0000 mg | Freq: Once | INTRAVENOUS | Status: AC
  Administered 2018-09-14: 2 mg via INTRAMUSCULAR
  Filled 2018-09-14: qty 1

## 2018-09-14 MED ORDER — ONDANSETRON HCL 4 MG PO TABS: 4.0000 mg | ORAL_TABLET | Freq: Four times a day (QID) | ORAL | Status: DC | PRN

## 2018-09-14 MED ORDER — METHOCARBAMOL 500 MG PO TABS
500.0000 mg | ORAL_TABLET | Freq: Three times a day (TID) | ORAL | Status: DC | PRN
  Administered 2018-09-14 – 2018-09-16 (×3): 500 mg via ORAL
  Filled 2018-09-14 (×5): qty 1

## 2018-09-14 MED ORDER — LORAZEPAM 2 MG/ML IJ SOLN: 1.0000 mg | Freq: Four times a day (QID) | INTRAMUSCULAR | Status: DC | PRN

## 2018-09-14 MED ORDER — TRAMADOL HCL 50 MG PO TABS
50.0000 mg | ORAL_TABLET | Freq: Four times a day (QID) | ORAL | Status: DC | PRN
  Administered 2018-09-15: 50 mg via ORAL
  Filled 2018-09-14: qty 1

## 2018-09-14 MED ORDER — ADULT MULTIVITAMIN W/MINERALS CH
1.0000 | ORAL_TABLET | Freq: Every day | ORAL | Status: DC
  Administered 2018-09-15 – 2018-09-16 (×2): 1 via ORAL
  Filled 2018-09-14 (×2): qty 1

## 2018-09-14 MED ORDER — ACETAMINOPHEN 325 MG PO TABS
650.0000 mg | ORAL_TABLET | Freq: Four times a day (QID) | ORAL | Status: DC | PRN
  Administered 2018-09-15 – 2018-09-16 (×3): 650 mg via ORAL
  Filled 2018-09-14 (×3): qty 2

## 2018-09-14 MED ORDER — LOPERAMIDE HCL 2 MG PO CAPS
2.0000 mg | ORAL_CAPSULE | ORAL | Status: DC | PRN
  Administered 2018-09-14: 2 mg via ORAL
  Filled 2018-09-14: qty 2
  Filled 2018-09-14: qty 1

## 2018-09-14 MED ORDER — SODIUM CHLORIDE 0.9 % IV SOLN: 250.0000 mL | INTRAVENOUS | Status: DC | PRN

## 2018-09-14 MED ORDER — ENOXAPARIN SODIUM 40 MG/0.4ML ~~LOC~~ SOLN: 40.0000 mg | SUBCUTANEOUS | Status: DC

## 2018-09-14 MED ORDER — CLONIDINE HCL 0.1 MG PO TABS
0.1000 mg | ORAL_TABLET | Freq: Once | ORAL | Status: AC
  Administered 2018-09-14: 0.1 mg via ORAL
  Filled 2018-09-14: qty 1

## 2018-09-14 MED ORDER — NICOTINE 21 MG/24HR TD PT24
21.0000 mg | MEDICATED_PATCH | Freq: Every day | TRANSDERMAL | Status: DC
  Filled 2018-09-14 (×2): qty 1
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
## 2018-09-14 NOTE — ED Triage Notes (Signed)
Wishes detox from herione, oxycontin and marijuana.
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
## 2018-09-14 NOTE — Progress Notes (Signed)
Patient has arrived on unit 1A and Dia CrawfordLisa Gram Siedlecki, RN has assumed care for this patient.

## 2018-09-15 ENCOUNTER — Encounter: Payer: Self-pay | Admitting: Psychiatry

## 2018-09-15 DIAGNOSIS — F1120 Opioid dependence, uncomplicated: Secondary | ICD-10-CM

## 2018-09-15 DIAGNOSIS — F1420 Cocaine dependence, uncomplicated: Secondary | ICD-10-CM

## 2018-09-15 DIAGNOSIS — F1020 Alcohol dependence, uncomplicated: Secondary | ICD-10-CM

## 2018-09-15 DIAGNOSIS — F1123 Opioid dependence with withdrawal: Secondary | ICD-10-CM

## 2018-09-15 LAB — CHLAMYDIA/NGC RT PCR (ARMC ONLY)
Chlamydia Tr: NOT DETECTED
N gonorrhoeae: NOT DETECTED

## 2018-09-15 MED ORDER — BUPRENORPHINE HCL-NALOXONE HCL 2-0.5 MG SL SUBL
2.0000 | SUBLINGUAL_TABLET | Freq: Once | SUBLINGUAL | Status: AC
  Administered 2018-09-15: 2 via SUBLINGUAL
  Filled 2018-09-15: qty 2

## 2018-09-15 MED ORDER — LORAZEPAM 2 MG/ML IJ SOLN
1.0000 mg | INTRAMUSCULAR | Status: DC | PRN
  Administered 2018-09-15: 1 mg via INTRAVENOUS
  Filled 2018-09-15: qty 1

## 2018-09-15 MED ORDER — LORAZEPAM 1 MG PO TABS
1.0000 mg | ORAL_TABLET | ORAL | Status: DC | PRN
  Administered 2018-09-16 (×2): 1 mg via ORAL
  Filled 2018-09-15 (×2): qty 1

## 2018-09-15 MED ORDER — BUPRENORPHINE HCL-NALOXONE HCL 2-0.5 MG SL SUBL
1.0000 | SUBLINGUAL_TABLET | Freq: Once | SUBLINGUAL | Status: AC
  Administered 2018-09-16: 1 via SUBLINGUAL
  Filled 2018-09-15: qty 1

## 2018-09-15 MED ORDER — BUPRENORPHINE HCL-NALOXONE HCL 2-0.5 MG SL SUBL
1.0000 | SUBLINGUAL_TABLET | Freq: Once | SUBLINGUAL | Status: AC
  Administered 2018-09-15: 1 via SUBLINGUAL
  Filled 2018-09-15: qty 1
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
## 2018-09-15 NOTE — Progress Notes (Signed)
Advanced care plan. Purpose of the Encounter: CODE STATUS Parties in Attendance:Patient Patient's Decision Capacity:Good Subjective/Patient's story: Presented to the emergency room for abuse of heroin and alcohol Objective/Medical story History of substance abuse History of heroin, cocaine and alcohol use Active tobacco abuser Elevated blood pressure Goals of care determination:  Advance care directives and goals of care discussed Treatment plan discussed CODE STATUS: Full code Time spent discussing advanced care planning: 16 minutes
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
## 2018-09-15 NOTE — Plan of Care (Signed)

## 2018-09-15 NOTE — Progress Notes (Addendum)
[facility] - [place] at [facility]   PATIENT NAME: Tracey Fisher    MR#:  [id number]  DATE OF BIRTH:  January 27, 1966  SUBJECTIVE:  CHIEF COMPLAINT:   Chief Complaint  Patient presents with  . Medical Clearance  Patient seen and evaluated today Has some increased anxiety No complaints of any pain Okay appetite  REVIEW OF SYSTEMS:    ROS  CONSTITUTIONAL: No documented fever. No fatigue, weakness. No weight gain, no weight loss.  EYES: No blurry or double vision.  ENT: No tinnitus. No postnasal drip. No redness of the oropharynx.  RESPIRATORY: No cough, no wheeze, no hemoptysis. No dyspnea.  CARDIOVASCULAR: No chest pain. No orthopnea. No palpitations. No syncope.  GASTROINTESTINAL: No nausea, no vomiting or diarrhea. No abdominal pain. No melena or hematochezia.  GENITOURINARY: No dysuria or hematuria.  ENDOCRINE: No polyuria or nocturia. No heat or cold intolerance.  HEMATOLOGY: No anemia. No bruising. No bleeding.  INTEGUMENTARY: No rashes. No lesions.  MUSCULOSKELETAL: No arthritis. No swelling. No gout.  NEUROLOGIC: No numbness, tingling, or ataxia. No seizure-type activity.  PSYCHIATRIC: Has anxiety. No insomnia. No ADD.   DRUG ALLERGIES:   Allergies  Allergen Reactions  . Naproxen   . Tramadol Rash    VITALS:  Blood pressure (!) 171/102, pulse 63, temperature 98.2 F (36.8 C), temperature source Oral, resp. rate 18, height 4\' 9"  (1.448 m), weight 56.7 kg, last menstrual period 08/25/2018, SpO2 100 %.  PHYSICAL EXAMINATION:   Physical Exam  GENERAL:  52 y.o.-year-old patient lying in the bed with no acute distress.  EYES: Pupils equal, round, reactive to light and accommodation. No scleral icterus. Extraocular muscles intact.  HEENT: Head atraumatic, normocephalic. Oropharynx and nasopharynx clear.  NECK:  Supple, no jugular venous distention. No thyroid enlargement, no tenderness.  LUNGS: Normal breath sounds bilaterally, no wheezing, rales,  rhonchi. No use of accessory muscles of respiration.  CARDIOVASCULAR: S1, S2 normal. No murmurs, rubs, or gallops.  ABDOMEN: Soft, nontender, nondistended. Bowel sounds present. No organomegaly or mass.  EXTREMITIES: No cyanosis, clubbing or edema b/l.    NEUROLOGIC: Cranial nerves II through XII are intact. No focal Motor or sensory deficits b/l.   PSYCHIATRIC: The patient is alert and oriented x 3.  SKIN: No obvious rash, lesion, or ulcer.   LABORATORY PANEL:   CBC Recent Labs  Lab 09/14/18 1855  WBC 7.8  HGB 13.0  HCT 36.9  PLT 213   ------------------------------------------------------------------------------------------------------------------ Chemistries  Recent Labs  Lab 09/14/18 1855  NA 140  K 3.7  CL 105  CO2 29  GLUCOSE 99  BUN 12  CREATININE 0.69  CALCIUM 9.2  AST 31  ALT 21  ALKPHOS 78  BILITOT 0.3   ------------------------------------------------------------------------------------------------------------------  Cardiac Enzymes No results for input(s): TROPONINI in the last 168 hours. ------------------------------------------------------------------------------------------------------------------  RADIOLOGY:  No results found.   ASSESSMENT AND PLAN:   52 year old female patient with history of substance abuse with cocaine, marijuana, alcohol was brought to the emergency room for withdrawal from heroin by the police  -Substance abuse Heroin withdrawal PRN Ativan Followedup psychiatric consultation suboxone protocol will be started with quick taper  -Tobacco abuse Tobacco cessation counseled to the patient for 6 minutes Nicotine patch offered  -Hypertension uncontrolled secondary to withdrawal Clonidine detox protocol  All the records are reviewed and case discussed with Care Management/Social Worker. Management plans discussed with the patient, family and they are in agreement.  CODE STATUS: Full code  DVT Prophylaxis: SCDs  TOTAL  TIME  TAKING CARE OF THIS PATIENT: 33 minutes.   POSSIBLE D/C IN 1 to 2 DAYS, DEPENDING ON CLINICAL CONDITION.  Ihor AustinPavan Abbigael Detlefsen M.D on 09/15/2018 at 12:38 PM  Between 7am to 6pm - Pager - [phone]  After 6pm go to [email or web address] - password [email or web address]  [facility] [place] [facility]  Office  [phone]  CC: Primary care physician; Patient, No Pcp Per  Note: This dictation was prepared with Dragon dictation along with smaller phrase technology. Any transcriptional errors that result from this process are unintentional.

## 2018-09-16 NOTE — Discharge Summary (Signed)
[facility] - [place] at [facility]   PATIENT NAME: Tracey Fisher    MR#:  [id number]  DATE OF BIRTH:  January 05, 1966  DATE OF ADMISSION:  09/14/2018 ADMITTING PHYSICIAN: Auburn BilberryShreyang Patel, MD  DATE OF DISCHARGE: 09/16/2018  PRIMARY CARE PHYSICIAN: Patient, No Pcp Per   ADMISSION DIAGNOSIS:  Cocaine withdrawal (HCC) [F14.23] Opioid withdrawal (HCC) [F11.23] Alcohol withdrawal syndrome without complication (HCC) [F10.230]  DISCHARGE DIAGNOSIS:  Principal Problem:   Drug withdrawal (HCC) Active Problems:   Opioid use disorder, severe, dependence (HCC)   Alcohol use disorder, moderate, dependence (HCC)   Cocaine use disorder, moderate, dependence (HCC)   SECONDARY DIAGNOSIS:  History reviewed. No pertinent past medical history.   ADMITTING HISTORY Tracey Fisher  is a 52 y.o. female with a known history of substance abuse who was doing cocaine marijuana and drinking alcohol with her daughter and have been living in an apartment together and have been according to them course by somebody to do drugs.  Police brought them to the ER because patient was withdrawing from heroin.   HOSPITAL COURSE:  Patient was admitted to medical floor.  Psychiatry consultation was done who started patient on quick buprenorphine detox tapering dose.  Patient completed.  Social worker consultation was done.  Patient has been enrolled in outpatient substance abuse and counseling programs.  Strictly counseled patient to stop drinking alcohol stop using cocaine marijuana and heroin and stop smoking.  No thoughts of hurting herself or hurting others.  IVC not indicated as per psychiatry consultation.  No imminent risk to self. Advised  And counseled healthy living .  Patient will be discharged home.  CONSULTS OBTAINED:  Treatment Team:  He, Jun, MD Clapacs, Jackquline DenmarkJohn T, MD  DRUG ALLERGIES:   Allergies  Allergen Reactions  . Naproxen   . Tramadol Rash    DISCHARGE MEDICATIONS:   Allergies  as of 09/16/2018      Reactions   Naproxen    Tramadol Rash      Medication List    STOP taking these medications   cephALEXin 500 MG capsule Commonly known as:  KEFLEX   meloxicam 15 MG tablet Commonly known as:  MOBIC   metroNIDAZOLE 500 MG tablet Commonly known as:  FLAGYL   oxyCODONE-acetaminophen 7.5-325 MG tablet Commonly known as:  PERCOCET   sulfamethoxazole-trimethoprim 800-160 MG tablet Commonly known as:  BACTRIM DS,SEPTRA DS     TAKE these medications   cyclobenzaprine 10 MG tablet Commonly known as:  FLEXERIL Take 1 tablet (10 mg total) by mouth 3 (three) times daily as needed.       Today  Patient seen and evaluated today No new complaints Tolerating diet well Resting well  VITAL SIGNS:  Blood pressure (!) 154/96, pulse (!) 57, temperature 97.7 F (36.5 C), temperature source Oral, resp. rate 18, height 4\' 9"  (1.448 m), weight 56.7 kg, last menstrual period 08/25/2018, SpO2 100 %.  I/O:    Intake/Output Summary (Last 24 hours) at 09/16/2018 1530 Last data filed at 09/16/2018 1330 Gross per 24 hour  Intake 240 ml  Output -  Net 240 ml    PHYSICAL EXAMINATION:  Physical Exam  GENERAL:  52 y.o.-year-old patient lying in the bed with no acute distress.  LUNGS: Normal breath sounds bilaterally, no wheezing, rales,rhonchi or crepitation. No use of accessory muscles of respiration.  CARDIOVASCULAR: S1, S2 normal. No murmurs, rubs, or gallops.  ABDOMEN: Soft, non-tender, non-distended. Bowel sounds present. No organomegaly or mass.  NEUROLOGIC: Moves all 4  extremities. PSYCHIATRIC: The patient is alert and oriented x 3.  SKIN: No obvious rash, lesion, or ulcer.   DATA REVIEW:   CBC Recent Labs  Lab 09/14/18 1855  WBC 7.8  HGB 13.0  HCT 36.9  PLT 213    Chemistries  Recent Labs  Lab 09/14/18 1855  NA 140  K 3.7  CL 105  CO2 29  GLUCOSE 99  BUN 12  CREATININE 0.69  CALCIUM 9.2  AST 31  ALT 21  ALKPHOS 78  BILITOT 0.3     Cardiac Enzymes No results for input(s): TROPONINI in the last 168 hours.  Microbiology Results  Results for orders placed or performed during the hospital encounter of 09/14/18  Chlamydia/NGC rt PCR ([facility] only)     Status: None   Collection Time: 09/15/18 10:00 AM  Result Value Ref Range Status   Specimen source GC/Chlam URINE, RANDOM  Final   Chlamydia Tr NOT DETECTED NOT DETECTED Final   N gonorrhoeae NOT DETECTED NOT DETECTED Final    Comment: (NOTE) This CT/NG assay has not been evaluated in patients with a history of  hysterectomy. Performed at [facility], [address] [place], [place] [address]     RADIOLOGY:  No results found.  Follow up with PCP in 1 week.  Management plans discussed with the patient, family and they are in agreement.  CODE STATUS: Full code    Code Status Orders  (From admission, onward)         Start     Ordered   09/14/18 2152  Full code  Continuous     09/14/18 2154        Code Status History    This patient has a current code status but no historical code status.      TOTAL TIME TAKING CARE OF THIS PATIENT ON DAY OF DISCHARGE: more than 34 minutes.   Ihor Austin M.D on 09/16/2018 at 3:30 PM  Between 7am to 6pm - Pager - [phone]  After 6pm go to [email or web address] - password [email or web address]  [facility] [place] [facility]  Office  [phone]  CC: Primary care physician; Patient, No Pcp Per  Note: This dictation was prepared with Dragon dictation along with smaller phrase technology. Any transcriptional errors that result from this process are unintentional.

## 2018-09-16 NOTE — Progress Notes (Signed)
Clinical Social worker (CSW) discussed case with CSW lead who has made arrangements for patient and [facility]. Per RN patient will D/C today.   Baker Hughes IncorporatedBailey Rito Lecomte, LCSW [phone]

## 2018-09-16 NOTE — Progress Notes (Signed)
Initial Nutrition Assessment  DOCUMENTATION CODES:   Not applicable  INTERVENTION:  Monitor PO intakes and wt trends   NUTRITION DIAGNOSIS:   Inadequate oral intake related to decreased appetite, social / environmental circumstances(heroin and EtOh abuse) as evidenced by energy intake < 75% for > 7 days.   GOAL:   Patient will meet greater than or equal to 90% of their needs   MONITOR:   PO intake, Weight trends  REASON FOR ASSESSMENT:   Malnutrition Screening Tool    ASSESSMENT:   Pt admitted on 9/21 for heroin withdrawal and seeking detox. Pt lives w/ daughter who also came into the ED w/ withdrawal symptoms.    Pt w/ lunch tray at bedside and consumed 45% of meal. Pt reports a poor PO since admission d/t not having marijuana which stimulates her appetite at home.   Pt reports having a good appetite PTA and likes to cook at home. Pt stated that she would have 1 meal/day and snack on potato chips, candy, and "junk food" throughout the day. Pt stated she has had recently been consuming a lot of chocolate at bedtime. Pt reports preparing "soul food" including pinto beans, baked/fried chicken, and greens and goes out to eat when for a meal if not prepared at home - likes [place], [employer], and [employer].   Pt reports not eating a few days at a time when she is using heroin; states that she has to remind herself to eat and loses track of when her last meal was consumed.  Pt also states that she walks a lot when she is "drying out" reports walking up to 5-10 miles during those times.   Medications reviewed: Multivitamin, Thiamine, Folic acid, Imodium  No pertinent labs  NUTRITION - FOCUSED PHYSICAL EXAM:    Most Recent Value  Orbital Region  No depletion  Upper Arm Region  No depletion  Thoracic and Lumbar Region  No depletion  Buccal Region  No depletion  Temple Region  No depletion  Clavicle Bone Region  No depletion  Clavicle and Acromion Bone Region  No depletion   Scapular Bone Region  No depletion  Dorsal Hand  No depletion  Patellar Region  No depletion  Anterior Thigh Region  No depletion  Edema (RD Assessment)  None  Hair  Reviewed  Eyes  Reviewed  Mouth  Reviewed  Skin  Reviewed  Nails  Reviewed       Diet Order:  Pt reports >50% of meals since admission Diet Order            Diet regular Room service appropriate? Yes; Fluid consistency: Thin  Diet effective now              EDUCATION NEEDS:   Not appropriate for education at this time  Skin:  Skin Assessment: Reviewed RN Assessment(WDL)  Last BM:  9/21 (Per RN note)  Height:   Ht Readings from Last 1 Encounters:  09/16/18 4' 9.01" (1.448 m)    Weight:   Wt Readings from Last 1 Encounters:  09/16/18 56.7 kg    Ideal Body Weight:  45 kg  BMI:  Body mass index is 27.04 kg/m.  Estimated Nutritional Needs:   Kcal:  1420-1700 (25-30kcal/kg)  Protein:  70-85grams (1.3-1.5)  Fluid:  1.7L    Lars MassonSuzanne Aulton Routt, RD, LDN  After Hours/Weekend Pager: [phone]

## 2018-09-17 LAB — HIV ANTIBODY (ROUTINE TESTING W REFLEX): HIV SCREEN 4TH GENERATION: NONREACTIVE

## 2019-02-14 ENCOUNTER — Encounter: Payer: Self-pay | Admitting: Emergency Medicine

## 2019-02-14 ENCOUNTER — Other Ambulatory Visit: Payer: Self-pay

## 2019-02-14 ENCOUNTER — Emergency Department: Payer: Self-pay

## 2019-02-14 ENCOUNTER — Inpatient Hospital Stay
Admission: EM | Admit: 2019-02-14 | Discharge: 2019-02-19 | DRG: 871 | Disposition: A | Discharge status: Home / Self Care | Payer: Self-pay | Attending: Internal Medicine | Admitting: Internal Medicine

## 2019-02-14 DIAGNOSIS — R000 Tachycardia, unspecified: Secondary | ICD-10-CM | POA: Diagnosis present

## 2019-02-14 DIAGNOSIS — Z23 Encounter for immunization: Secondary | ICD-10-CM

## 2019-02-14 DIAGNOSIS — A419 Sepsis, unspecified organism: Principal | ICD-10-CM | POA: Diagnosis present

## 2019-02-14 DIAGNOSIS — J9601 Acute respiratory failure with hypoxia: Secondary | ICD-10-CM | POA: Diagnosis present

## 2019-02-14 DIAGNOSIS — R509 Fever, unspecified: Secondary | ICD-10-CM

## 2019-02-14 DIAGNOSIS — Z79899 Other long term (current) drug therapy: Secondary | ICD-10-CM

## 2019-02-14 DIAGNOSIS — R0600 Dyspnea, unspecified: Secondary | ICD-10-CM

## 2019-02-14 DIAGNOSIS — J111 Influenza due to unidentified influenza virus with other respiratory manifestations: Secondary | ICD-10-CM

## 2019-02-14 DIAGNOSIS — J1000 Influenza due to other identified influenza virus with unspecified type of pneumonia: Secondary | ICD-10-CM | POA: Diagnosis present

## 2019-02-14 DIAGNOSIS — Z8249 Family history of ischemic heart disease and other diseases of the circulatory system: Secondary | ICD-10-CM

## 2019-02-14 DIAGNOSIS — J189 Pneumonia, unspecified organism: Secondary | ICD-10-CM

## 2019-02-14 DIAGNOSIS — F17210 Nicotine dependence, cigarettes, uncomplicated: Secondary | ICD-10-CM | POA: Diagnosis present

## 2019-02-14 DIAGNOSIS — F1190 Opioid use, unspecified, uncomplicated: Secondary | ICD-10-CM | POA: Diagnosis present

## 2019-02-14 LAB — COMPREHENSIVE METABOLIC PANEL
ALBUMIN: 3.8 g/dL (ref 3.5–5.0)
ALT: 68 U/L — AB (ref 0–44)
AST: 116 U/L — ABNORMAL HIGH (ref 15–41)
Alkaline Phosphatase: 65 U/L (ref 38–126)
Anion gap: 11 (ref 5–15)
BUN: 10 mg/dL (ref 6–20)
CO2: 22 mmol/L (ref 22–32)
CREATININE: 0.72 mg/dL (ref 0.44–1.00)
Calcium: 8.2 mg/dL — ABNORMAL LOW (ref 8.9–10.3)
Chloride: 101 mmol/L (ref 98–111)
GFR calc Af Amer: >60 mL/min (ref >60)
GFR calc non Af Amer: >60 mL/min (ref >60)
GLUCOSE: 151 mg/dL — AB (ref 70–99)
Potassium: 4 mmol/L (ref 3.5–5.1)
SODIUM: 134 mmol/L — AB (ref 135–145)
Total Bilirubin: 1 mg/dL (ref 0.3–1.2)
Total Protein: 7 g/dL (ref 6.5–8.1)

## 2019-02-14 LAB — CBC
HCT: 38.7 % (ref 36.0–46.0)
HEMOGLOBIN: 12.7 g/dL (ref 12.0–15.0)
MCH: 29.1 pg (ref 26.0–34.0)
MCHC: 32.8 g/dL (ref 30.0–36.0)
MCV: 88.6 fL (ref 80.0–100.0)
NRBC: 0 % (ref 0.0–0.2)
Platelets: 170 10*3/uL (ref 150–400)
RBC: 4.37 MIL/uL (ref 3.87–5.11)
RDW: 14.3 % (ref 11.5–15.5)
WBC: 8.7 10*3/uL (ref 4.0–10.5)

## 2019-02-14 LAB — INFLUENZA PANEL BY PCR (TYPE A & B)
INFLBPCR: NEGATIVE
Influenza A By PCR: POSITIVE — AB

## 2019-02-14 LAB — TROPONIN I: Troponin I: <0.03 ng/mL (ref <0.03)

## 2019-02-14 MED ORDER — IPRATROPIUM-ALBUTEROL 0.5-2.5 (3) MG/3ML IN SOLN
3.0000 mL | Freq: Four times a day (QID) | RESPIRATORY_TRACT | Status: DC | PRN
  Administered 2019-02-15 – 2019-02-16 (×5): 3 mL via RESPIRATORY_TRACT
  Filled 2019-02-14 (×6): qty 3

## 2019-02-14 MED ORDER — ENOXAPARIN SODIUM 40 MG/0.4ML ~~LOC~~ SOLN
40.0000 mg | SUBCUTANEOUS | Status: DC
  Administered 2019-02-14 – 2019-02-18 (×5): 40 mg via SUBCUTANEOUS
  Filled 2019-02-14 (×5): qty 0.4

## 2019-02-14 MED ORDER — HYDROCODONE-ACETAMINOPHEN 5-325 MG PO TABS
1.0000 | ORAL_TABLET | ORAL | Status: DC | PRN
  Administered 2019-02-18 – 2019-02-19 (×4): 1 via ORAL
  Filled 2019-02-14 (×4): qty 1

## 2019-02-14 MED ORDER — OSELTAMIVIR PHOSPHATE 75 MG PO CAPS
75.0000 mg | ORAL_CAPSULE | Freq: Once | ORAL | Status: AC
  Administered 2019-02-14: 75 mg via ORAL
  Filled 2019-02-14: qty 1

## 2019-02-14 MED ORDER — SODIUM CHLORIDE 0.9 % IV SOLN
INTRAVENOUS | Status: DC
  Administered 2019-02-14 – 2019-02-17 (×5): via INTRAVENOUS

## 2019-02-14 MED ORDER — ACETAMINOPHEN 325 MG PO TABS
650.0000 mg | ORAL_TABLET | Freq: Four times a day (QID) | ORAL | Status: DC | PRN
  Administered 2019-02-15 – 2019-02-17 (×5): 650 mg via ORAL
  Filled 2019-02-14 (×5): qty 2

## 2019-02-14 MED ORDER — PNEUMOCOCCAL VAC POLYVALENT 25 MCG/0.5ML IJ INJ
0.5000 mL | INJECTION | INTRAMUSCULAR | Status: AC
  Administered 2019-02-15: 0.5 mL via INTRAMUSCULAR
  Filled 2019-02-14: qty 0.5

## 2019-02-14 MED ORDER — ACETAMINOPHEN 650 MG RE SUPP: 650.0000 mg | Freq: Four times a day (QID) | RECTAL | Status: DC | PRN

## 2019-02-14 MED ORDER — METHADONE HCL 10 MG PO TABS
40.0000 mg | ORAL_TABLET | Freq: Every day | ORAL | Status: DC
  Administered 2019-02-14 – 2019-02-19 (×6): 40 mg via ORAL
  Filled 2019-02-14 (×6): qty 4

## 2019-02-14 MED ORDER — POLYETHYLENE GLYCOL 3350 17 G PO PACK: 17.0000 g | PACK | Freq: Every day | ORAL | Status: DC | PRN

## 2019-02-14 MED ORDER — ONDANSETRON HCL 4 MG PO TABS: 4.0000 mg | ORAL_TABLET | Freq: Four times a day (QID) | ORAL | Status: DC | PRN

## 2019-02-14 MED ORDER — ONDANSETRON HCL 4 MG/2ML IJ SOLN
4.0000 mg | Freq: Four times a day (QID) | INTRAMUSCULAR | Status: DC | PRN
  Administered 2019-02-14 – 2019-02-18 (×4): 4 mg via INTRAVENOUS
  Filled 2019-02-14 (×4): qty 2

## 2019-02-14 MED ORDER — SODIUM CHLORIDE 0.9 % IV SOLN
1.0000 g | Freq: Once | INTRAVENOUS | Status: AC
  Administered 2019-02-14: 1 g via INTRAVENOUS
  Filled 2019-02-14: qty 10

## 2019-02-14 MED ORDER — SODIUM CHLORIDE 0.9 % IV SOLN
500.0000 mg | Freq: Once | INTRAVENOUS | Status: AC
  Administered 2019-02-14: 500 mg via INTRAVENOUS
  Filled 2019-02-14: qty 500

## 2019-02-14 MED ORDER — SODIUM CHLORIDE 0.9 % IV SOLN
1.0000 g | INTRAVENOUS | Status: DC
  Administered 2019-02-15 – 2019-02-18 (×4): 1 g via INTRAVENOUS
  Filled 2019-02-14 (×3): qty 1
  Filled 2019-02-14: qty 10
  Filled 2019-02-14: qty 1

## 2019-02-14 MED ORDER — AZITHROMYCIN 500 MG PO TABS
500.0000 mg | ORAL_TABLET | Freq: Every day | ORAL | Status: DC
  Administered 2019-02-15 – 2019-02-18 (×4): 500 mg via ORAL
  Filled 2019-02-14 (×4): qty 1

## 2019-02-14 MED ORDER — OSELTAMIVIR PHOSPHATE 75 MG PO CAPS
75.0000 mg | ORAL_CAPSULE | Freq: Two times a day (BID) | ORAL | Status: AC
  Administered 2019-02-14 – 2019-02-18 (×9): 75 mg via ORAL
  Filled 2019-02-14 (×9): qty 1

## 2019-02-14 MED ORDER — INFLUENZA VAC SPLIT QUAD 0.5 ML IM SUSY
0.5000 mL | PREFILLED_SYRINGE | INTRAMUSCULAR | Status: AC
  Administered 2019-02-15: 0.5 mL via INTRAMUSCULAR
  Filled 2019-02-14: qty 0.5

## 2019-02-14 NOTE — ED Notes (Signed)
Attempted report. Floor RN unavailable momentarily. Will call back soon.

## 2019-02-14 NOTE — Care Management Note (Signed)
Case Management Note  Patient Details  Name: ALETHEIA BICKLER MRN: [id number] Date of Birth: 07/14/1966  Subjective/Objective:   RNCM consulted on patient as she is without insurance. Patient tells me she is in the process of applying for disability. Patient lives at home with her daughter. She is independent at baseline and completes all activities of daily living without any DME. Patient typically uses Psychologist, forensic and has no PCP. I have provided and application and information about medication management and [facility]. Patient still drives.  Barrier to discharge: patient is flu positive and also noted to have PNA. Receiving IV abx, steroids and breathing treatments.              Action/Plan:   Expected Discharge Date:                  Expected Discharge Plan:     In-House Referral:     Discharge planning Services     Post Acute Care Choice:    Choice offered to:     DME Arranged:    DME Agency:     HH Arranged:    HH Agency:     Status of Service:     If discussed at [employer] of Stay Meetings, dates discussed:    Additional Comments:  Virgel Manifold, RN 02/14/2019, 12:24 PM

## 2019-02-14 NOTE — ED Notes (Signed)
Room temp turned up & pt given warm blankets.

## 2019-02-14 NOTE — ED Triage Notes (Signed)
Patient from home via [facility]. Reports last night started having difficulty breathing. Denies oxygen use at home. Patient is current smoker. Denies history of recent illness. Denies pulmonary history. Patient in tripod position upon arriving to ED. MD at bedside.

## 2019-02-14 NOTE — ED Provider Notes (Signed)
[facility] Emergency Department Provider Note  Time seen: 8:01 AM  I have reviewed the triage vital signs and the nursing notes.   HISTORY  Chief Complaint Shortness of Breath    HPI Tracey Fisher is a 53 y.o. female with a past medical history of substance use, presents to the emergency department for acute onset of shortness of breath since last night.  According to the patient since yesterday she has been feeling short of breath, has been diaphoretic at times.  Denies any chest pain or abdominal pain.  No leg pain or swelling.  States she has been coughing with occasional yellow sputum production.  EMS states upon arrival patient had a 92% room air saturation with audible wheeze.  Was given an albuterol breathing treatment, 3 duo nebs and 125 mg of Solu-Medrol.   No past medical history on file.  Patient Active Problem List   Diagnosis Date Noted  . Opioid use disorder, severe, dependence (HCC) 09/15/2018  . Alcohol use disorder, moderate, dependence (HCC) 09/15/2018  . Cocaine use disorder, moderate, dependence (HCC) 09/15/2018  . Drug withdrawal (HCC) 09/14/2018    No past surgical history on file.  Prior to Admission medications   Medication Sig Start Date End Date Taking? Authorizing Provider  cyclobenzaprine (FLEXERIL) 10 MG tablet Take 1 tablet (10 mg total) by mouth 3 (three) times daily as needed. 06/16/17   Joni Reining, PA-C    Allergies  Allergen Reactions  . Naproxen   . Tramadol Rash    Family History  Adopted: Yes  Problem Relation Age of Onset  . Hypertension Mother     Social History Social History   Tobacco Use  . Smoking status: Current Every Day Smoker    Packs/day: 1.00    Types: Cigarettes  . Smokeless tobacco: Never Used  Substance Use Topics  . Alcohol use: No  . Drug use: Yes    Types: Marijuana, "Crack" cocaine    Review of Systems Constitutional: Negative for fever. ENT: Negative for recent  illness/congestion Cardiovascular: Negative for chest pain. Respiratory: Positive for shortness of breath.  Positive for occasional cough.  Positive yellow sputum production. Gastrointestinal: Negative for abdominal pain Musculoskeletal: Negative for leg pain or swelling. Skin: Negative for skin complaints  Neurological: Negative for headache All other ROS negative  ____________________________________________   PHYSICAL EXAM:  VITAL SIGNS: ED Triage Vitals  Enc Vitals Group     BP      Pulse      Resp      Temp      Temp src      SpO2      Weight      Height      Head Circumference      Peak Flow      Pain Score      Pain Loc      Pain Edu?      Excl. in GC?     Constitutional: Alert, mild distress sitting upright in bed with difficulty breathing. Eyes: Normal exam ENT   Head: Normocephalic and atraumatic.   Nose: No congestion/rhinnorhea.   Mouth/Throat: Mucous membranes are moist. Cardiovascular: Normal rate, regular rhythm. No murmur Respiratory: Mild tachypnea mild expiratory wheeze auscultated in all lung fields.  No obvious rales or rhonchi.  Occasional cough. Gastrointestinal: Soft and nontender. No distention.  Musculoskeletal: Nontender with normal range of motion in all extremities. No lower extremity tenderness or edema. Neurologic:  Normal speech and language. No  gross focal neurologic deficits are appreciated. Skin:  Skin is warm, dry and intact.  Psychiatric: Somewhat anxious in appearance.  ____________________________________________    EKG  EKG viewed and interpreted by myself shows a normal sinus rhythm 85 bpm with a narrow QRS, normal axis, normal intervals, nonspecific ST changes.  ____________________________________________    RADIOLOGY  Left-sided pneumonia.  ____________________________________________   INITIAL IMPRESSION / ASSESSMENT AND PLAN / ED COURSE  Pertinent labs & imaging results that were available during  my care of the patient were reviewed by me and considered in my medical decision making (see chart for details).  Patient presents to the emergency department for shortness of breath starting last night.  Differential at this time would include undiagnosed COPD/reactive airway disease, pneumonia, bronchitis, pneumonitis, ACS/pulmonary edema.  We will check labs including cardiac enzymes, BNP, chest x-ray.  We will swab for influenza as a precaution.  Patient has received appropriate treatment by EMS including 3 duo nebs and 125 mg of Solu-Medrol.  Patient currently on 2 L of oxygen satting 96 to 98% continues to be mildly tachypneic, but admits that she feels very anxious/scared.  Patient's results show left-sided pneumonia.  Influenza A positive.  We will start on antibiotics as well as Tamiflu.  Given the patient's hypoxia will admit to the hospitalist service for further treatment.  ____________________________________________   FINAL CLINICAL IMPRESSION(S) / ED DIAGNOSES  Dyspnea Influenza A Pneumonia   Minna Antis, MD 02/14/19 [phone]

## 2019-02-14 NOTE — H&P (Signed)
[facility] [place] at [facility]   PATIENT NAME: Tracey Fisher    MR#:  [id number]  DATE OF BIRTH:  11/29/1966  DATE OF ADMISSION:  02/14/2019  PRIMARY CARE PHYSICIAN: Patient, No Pcp Per   REQUESTING/REFERRING PHYSICIAN: dr Lenard Lance  CHIEF COMPLAINT:   SOB HISTORY OF PRESENT ILLNESS:  Tracey Fisher  is a 53 y.o. female with a known history of polysubstancea abuse who presents to the ED with SOB, cough and malaise. Her symptoms have been present for 2 days however worse over the last 24 hours. She tested positive for the flu and CXR show PNA.  She was started on antibiotics and tamiflu as well as given breathing treatments for audible wheezing, however she now has a new O2 requirement.  PAST MEDICAL HISTORY:  Polysubstance abuse  PAST SURGICAL HISTORY:  none  SOCIAL HISTORY:   Social History   Tobacco Use  . Smoking status: Current Every Day Smoker    Packs/day: 1.00    Types: Cigarettes  . Smokeless tobacco: Never Used  Substance Use Topics  . Alcohol use: No    FAMILY HISTORY:   Family History  Adopted: Yes  Problem Relation Age of Onset  . Hypertension Mother     DRUG ALLERGIES:   Allergies  Allergen Reactions  . Naproxen   . Tramadol Rash    REVIEW OF SYSTEMS:   Review of Systems  Constitutional: Positive for chills, fever and malaise/fatigue.  HENT: Negative.  Negative for ear discharge, ear pain, hearing loss, nosebleeds and sore throat.   Eyes: Negative.  Negative for blurred vision and pain.  Respiratory: Positive for cough and shortness of breath. Negative for hemoptysis and wheezing.   Cardiovascular: Negative.  Negative for chest pain, palpitations and leg swelling.  Gastrointestinal: Negative.  Negative for abdominal pain, blood in stool, diarrhea, nausea and vomiting.  Genitourinary: Negative.  Negative for dysuria.  Musculoskeletal: Negative.  Negative for back pain.  Skin: Negative.   Neurological: Negative for  dizziness, tremors, speech change, focal weakness, seizures and headaches.  Endo/Heme/Allergies: Negative.  Does not bruise/bleed easily.  Psychiatric/Behavioral: Negative.  Negative for depression, hallucinations and suicidal ideas.    MEDICATIONS AT HOME:   Prior to Admission medications   Medication Sig Start Date End Date Taking? Authorizing Provider  cyclobenzaprine (FLEXERIL) 10 MG tablet Take 1 tablet (10 mg total) by mouth 3 (three) times daily as needed. 06/16/17   Joni Reining, PA-C    methadone 40 mg daily  VITAL SIGNS:  Blood pressure 133/74, pulse (!) 107, temperature 98.6 F (37 C), temperature source Oral, resp. rate (!) 24, height 4\' 9"  (1.448 m), weight 63.5 kg, last menstrual period 01/14/2019, SpO2 96 %.  PHYSICAL EXAMINATION:   Physical Exam Constitutional:      General: She is not in acute distress. HENT:     Head: Normocephalic.  Eyes:     General: No scleral icterus. Neck:     Musculoskeletal: Normal range of motion and neck supple.     Vascular: No JVD.     Trachea: No tracheal deviation.  Cardiovascular:     Rate and Rhythm: Normal rate and regular rhythm.     Heart sounds: Normal heart sounds. No murmur. No friction rub. No gallop.   Pulmonary:     Effort: Pulmonary effort is normal. No respiratory distress.     Breath sounds: Normal breath sounds. No wheezing or rales.  Chest:     Chest wall: No tenderness.  Abdominal:  General: Bowel sounds are normal. There is no distension.     Palpations: Abdomen is soft. There is no mass.     Tenderness: There is no abdominal tenderness. There is no guarding or rebound.  Musculoskeletal: Normal range of motion.  Skin:    General: Skin is warm.     Findings: No erythema or rash.  Neurological:     Mental Status: She is alert and oriented to person, place, and time.  Psychiatric:        Judgment: Judgment normal.       LABORATORY PANEL:   CBC Recent Labs  Lab 02/14/19 0811  WBC 8.7  HGB  12.7  HCT 38.7  PLT 170   ------------------------------------------------------------------------------------------------------------------  Chemistries  Recent Labs  Lab 02/14/19 0811  NA 134*  K 4.0  CL 101  CO2 22  GLUCOSE 151*  BUN 10  CREATININE 0.72  CALCIUM 8.2*  AST 116*  ALT 68*  ALKPHOS 65  BILITOT 1.0   ------------------------------------------------------------------------------------------------------------------  Cardiac Enzymes Recent Labs  Lab 02/14/19 0811  TROPONINI <0.03   ------------------------------------------------------------------------------------------------------------------  RADIOLOGY:  Dg Chest Portable 1 View  Result Date: 02/14/2019 CLINICAL DATA:  Shortness of breath EXAM: PORTABLE CHEST 1 VIEW COMPARISON:  10/30/2009 FINDINGS: Mild patchy density over the left mid lung. Normal heart size. No Kerley lines or effusion. No pneumothorax. IMPRESSION: Mild patchy density on the left, most likely pneumonia in the appropriate clinical setting. Electronically Signed   By: Marnee Spring M.D.   On: 02/14/2019 08:14    EKG:   Orders placed or performed during the hospital encounter of 02/14/19  . EKG 12-Lead  . EKG 12-Lead    IMPRESSION AND PLAN:   53 y/o female who presents to the ED with SOB and found to have CAP and Influenza A.  1. Sepsis : Patient here with tachycardia, tachypnea. Sepsis is from CAP and influenza. Continue IVF Follow lactic acid Continue ABX/Tamiflu  2.acute hypoxic respiratory failure due to  CAP, Left lower lobe: Continue Rocephin and Azithromycin  3. Influenza A: Tamiflu for a total of 5 days. Droplet Isolation   4. Tobacco dependence: Patient is encouraged to quit smoking. Counseling was provided for 4 minutes.  5. Polysubstance abuse: on methadone daily   All the records are reviewed and case discussed with ED provider. Management plans discussed with the patient and she is in agreement  CODE  STATUS: full  TOTAL TIME TAKING CARE OF THIS PATIENT: 45 minutes.    Dez Stauffer M.D on 02/14/2019 at 9:42 AM  Between 7am to 6pm - Pager - [phone]  After 6pm go to [email or web address] - password [email or web address]  [facility] [place] [facility]  Office  [phone]  CC: Primary care physician; Patient, No Pcp Per

## 2019-02-14 NOTE — ED Notes (Addendum)
Pt up to bedside toilet. Pt denies nausea now.

## 2019-02-15 LAB — BLOOD CULTURE ID PANEL (REFLEXED)

## 2019-02-15 LAB — BASIC METABOLIC PANEL
Anion gap: 6 (ref 5–15)
BUN: 12 mg/dL (ref 6–20)
CHLORIDE: 104 mmol/L (ref 98–111)
CO2: 27 mmol/L (ref 22–32)
Calcium: 8.5 mg/dL — ABNORMAL LOW (ref 8.9–10.3)
Creatinine, Ser: 0.69 mg/dL (ref 0.44–1.00)
GFR calc Af Amer: >60 mL/min (ref >60)
GFR calc non Af Amer: >60 mL/min (ref >60)
Glucose, Bld: 106 mg/dL — ABNORMAL HIGH (ref 70–99)
Potassium: 3.9 mmol/L (ref 3.5–5.1)
Sodium: 137 mmol/L (ref 135–145)

## 2019-02-15 LAB — CBC
HCT: 38.1 % (ref 36.0–46.0)
Hemoglobin: 12.3 g/dL (ref 12.0–15.0)
MCH: 28.9 pg (ref 26.0–34.0)
MCHC: 32.3 g/dL (ref 30.0–36.0)
MCV: 89.6 fL (ref 80.0–100.0)
NRBC: 0 % (ref 0.0–0.2)
Platelets: 171 10*3/uL (ref 150–400)
RBC: 4.25 MIL/uL (ref 3.87–5.11)
RDW: 14.6 % (ref 11.5–15.5)
WBC: 12.5 10*3/uL — ABNORMAL HIGH (ref 4.0–10.5)

## 2019-02-15 MED ORDER — GUAIFENESIN-DM 100-10 MG/5ML PO SYRP
5.0000 mL | ORAL_SOLUTION | ORAL | Status: DC | PRN
  Administered 2019-02-15 – 2019-02-16 (×2): 5 mL via ORAL
  Filled 2019-02-15 (×2): qty 5

## 2019-02-15 NOTE — Progress Notes (Signed)
[facility] - [place] at [facility]   PATIENT NAME: Tracey Fisher    MR#:  [id number]  DATE OF BIRTH:  04-27-1966  SUBJECTIVE:  CHIEF COMPLAINT:   Chief Complaint  Patient presents with  . Shortness of Breath    REVIEW OF SYSTEMS:  Review of Systems  Constitutional: Negative for chills and fever.  HENT: Negative for hearing loss and tinnitus.   Eyes: Negative for blurred vision.  Respiratory: Negative for cough and hemoptysis.   Cardiovascular: Negative for chest pain and palpitations.  Gastrointestinal: Negative for abdominal pain, heartburn, nausea and vomiting.  Genitourinary: Negative for dysuria and urgency.  Musculoskeletal: Negative for myalgias and neck pain.  Skin: Negative for itching and rash.  Neurological: Positive for headaches.  Psychiatric/Behavioral: Negative for depression and hallucinations.      DRUG ALLERGIES:   Allergies  Allergen Reactions  . Naproxen   . Tramadol Rash   VITALS:  Blood pressure 126/84, pulse 81, temperature 99.1 F (37.3 C), temperature source Oral, resp. rate (!) 24, height 4\' 9"  (1.448 m), weight 63.5 kg, last menstrual period 01/14/2019, SpO2 100 %. PHYSICAL EXAMINATION:   Physical Exam  Constitutional: She is oriented to person, place, and time and well-developed, well-nourished, and in no distress.  HENT:  Head: Normocephalic and atraumatic.  Eyes: Pupils are equal, round, and reactive to light. Conjunctivae and EOM are normal.  Neck: Neck supple. No tracheal deviation present.  Cardiovascular: Normal rate, regular rhythm and normal heart sounds.  Pulmonary/Chest: Effort normal and breath sounds normal. She has no wheezes.  Abdominal: Soft. Bowel sounds are normal. There is no abdominal tenderness.  Musculoskeletal: Normal range of motion.        General: Edema present.  Neurological: She is alert and oriented to person, place, and time.  Skin: Skin is warm. She is not diaphoretic. No erythema.    Psychiatric: Affect normal.   LABORATORY PANEL:  Female CBC Recent Labs  Lab 02/15/19 0532  WBC 12.5*  HGB 12.3  HCT 38.1  PLT 171   ------------------------------------------------------------------------------------------------------------------ Chemistries  Recent Labs  Lab 02/14/19 0811 02/15/19 0532  NA 134* 137  K 4.0 3.9  CL 101 104  CO2 22 27  GLUCOSE 151* 106*  BUN 10 12  CREATININE 0.72 0.69  CALCIUM 8.2* 8.5*  AST 116*  --   ALT 68*  --   ALKPHOS 65  --   BILITOT 1.0  --    RADIOLOGY:  No results found. ASSESSMENT AND PLAN:    53 y/o female who presented to the ED with SOB and found to have CAP and Influenza A.  1. Sepsis : Patient here with tachycardia, tachypnea. Sepsis is from CAP and influenza. Continue IVF Follow lactic acid Continue ABX/Tamiflu 1 out of 4 set of blood cultures growing staph species.  Most likely contaminant.  Repeat blood cultures requested.  2.acute hypoxic respiratory failure due to  CAP, Left lower lobe: Continue Rocephin and Azithromycin  3. Influenza A: Tamiflu for a total of 5 days. Droplet Isolation   4. Tobacco dependence: Patient is encouraged to quit smoking. Counseling was provided for 4 minutes.  5. Polysubstance abuse: on methadone daily  DVT prophylaxis; Lovenox    All the records are reviewed and case discussed with Care Management/Social Worker. Management plans discussed with the patient, family and they are in agreement.  CODE STATUS: Full Code  TOTAL TIME TAKING CARE OF THIS PATIENT: 25 minutes.   More than 50% of the time  was spent in counseling/coordination of care: YES  POSSIBLE D/C IN 1-2 DAYS, DEPENDING ON CLINICAL CONDITION.   Alianny Toelle M.D on 02/15/2019 at 2:48 PM  Between 7am to 6pm - Pager - [phone]  After 6pm go to [email or web address] - Social research officer, government  [facility] [place] [facility]  Office  [phone]  CC: Primary care physician; Patient, No Pcp  Per  Note: This dictation was prepared with Dragon dictation along with smaller phrase technology. Any transcriptional errors that result from this process are unintentional.

## 2019-02-15 NOTE — Progress Notes (Signed)
PHARMACY - PHYSICIAN COMMUNICATION CRITICAL VALUE ALERT - BLOOD CULTURE IDENTIFICATION (BCID)  Tracey Fisher is an 53 y.o. female who presented to [facility] on 02/14/2019 with a chief complaint of SOB  Assessment:  BCID showing 1 of 4 staph species  Name of physician (or Provider) Contacted: Dr. Enid Baas  Current antibiotics: ceftriaxone and azithromycin   Changes to prescribed antibiotics recommended:  No changes at this time. Most likely represents a contaminant. MD to repeat blood cultures    Results for orders placed or performed during the hospital encounter of 02/14/19  Blood Culture ID Panel (Reflexed) (Collected: 02/14/2019  9:07 AM)  Result Value Ref Range   Enterococcus species NOT DETECTED NOT DETECTED   Listeria monocytogenes NOT DETECTED NOT DETECTED   Staphylococcus species DETECTED (A) NOT DETECTED   Staphylococcus aureus (BCID) NOT DETECTED NOT DETECTED   Methicillin resistance NOT DETECTED NOT DETECTED   Streptococcus species NOT DETECTED NOT DETECTED   Streptococcus agalactiae NOT DETECTED NOT DETECTED   Streptococcus pneumoniae NOT DETECTED NOT DETECTED   Streptococcus pyogenes NOT DETECTED NOT DETECTED   Acinetobacter baumannii NOT DETECTED NOT DETECTED   Enterobacteriaceae species NOT DETECTED NOT DETECTED   Enterobacter cloacae complex NOT DETECTED NOT DETECTED   Escherichia coli NOT DETECTED NOT DETECTED   Klebsiella oxytoca NOT DETECTED NOT DETECTED   Klebsiella pneumoniae NOT DETECTED NOT DETECTED   Proteus species NOT DETECTED NOT DETECTED   Serratia marcescens NOT DETECTED NOT DETECTED   Haemophilus influenzae NOT DETECTED NOT DETECTED   Neisseria meningitidis NOT DETECTED NOT DETECTED   Pseudomonas aeruginosa NOT DETECTED NOT DETECTED   Candida albicans NOT DETECTED NOT DETECTED   Candida glabrata NOT DETECTED NOT DETECTED   Candida krusei NOT DETECTED NOT DETECTED   Candida parapsilosis NOT DETECTED NOT DETECTED   Candida tropicalis NOT DETECTED NOT  DETECTED    Gardner Candle, PharmD, BCPS Clinical Pharmacist 02/15/2019 9:34 AM

## 2019-02-15 NOTE — Plan of Care (Signed)
  Problem: Health Behavior/Discharge Planning: Goal: Ability to manage health-related needs will improve Outcome: Progressing   Problem: Clinical Measurements: Goal: Ability to maintain clinical measurements within normal limits will improve Outcome: Progressing Goal: Will remain free from infection Outcome: Progressing Goal: Diagnostic test results will improve Outcome: Progressing Goal: Respiratory complications will improve Outcome: Progressing Goal: Cardiovascular complication will be avoided Outcome: Progressing   Problem: Activity: Goal: Risk for activity intolerance will decrease Outcome: Progressing   Problem: Nutrition: Goal: Adequate nutrition will be maintained Outcome: Progressing   Problem: Elimination: Goal: Will not experience complications related to bowel motility Outcome: Progressing   Problem: Pain Managment: Goal: General experience of comfort will improve Outcome: Progressing   Problem: Skin Integrity: Goal: Risk for impaired skin integrity will decrease Outcome: Progressing   Problem: Activity: Goal: Ability to tolerate increased activity will improve Outcome: Progressing   Problem: Clinical Measurements: Goal: Ability to maintain a body temperature in the normal range will improve Outcome: Progressing   Problem: Respiratory: Goal: Ability to maintain adequate ventilation will improve Outcome: Progressing Goal: Ability to maintain a clear airway will improve Outcome: Progressing

## 2019-02-16 ENCOUNTER — Inpatient Hospital Stay: Payer: Self-pay

## 2019-02-16 LAB — CBC
HCT: 33.2 % — ABNORMAL LOW (ref 36.0–46.0)
Hemoglobin: 10.6 g/dL — ABNORMAL LOW (ref 12.0–15.0)
MCH: 29 pg (ref 26.0–34.0)
MCHC: 31.9 g/dL (ref 30.0–36.0)
MCV: 91 fL (ref 80.0–100.0)
NRBC: 0 % (ref 0.0–0.2)
PLATELETS: 155 10*3/uL (ref 150–400)
RBC: 3.65 MIL/uL — ABNORMAL LOW (ref 3.87–5.11)
RDW: 14.6 % (ref 11.5–15.5)
WBC: 6.2 10*3/uL (ref 4.0–10.5)

## 2019-02-16 LAB — BASIC METABOLIC PANEL
Anion gap: 6 (ref 5–15)
BUN: 8 mg/dL (ref 6–20)
CO2: 28 mmol/L (ref 22–32)
Calcium: 7.7 mg/dL — ABNORMAL LOW (ref 8.9–10.3)
Chloride: 97 mmol/L — ABNORMAL LOW (ref 98–111)
Creatinine, Ser: 0.58 mg/dL (ref 0.44–1.00)
GFR calc Af Amer: >60 mL/min (ref >60)
GFR calc non Af Amer: >60 mL/min (ref >60)
GLUCOSE: 73 mg/dL (ref 70–99)
Potassium: 3.9 mmol/L (ref 3.5–5.1)
Sodium: 131 mmol/L — ABNORMAL LOW (ref 135–145)

## 2019-02-16 LAB — URINALYSIS, ROUTINE W REFLEX MICROSCOPIC
BACTERIA UA: NONE SEEN
Bilirubin Urine: NEGATIVE
Glucose, UA: NEGATIVE mg/dL
Ketones, ur: 5 mg/dL — AB
Leukocytes,Ua: NEGATIVE
Nitrite: NEGATIVE
Protein, ur: NEGATIVE mg/dL
RBC / HPF: >50 RBC/hpf — ABNORMAL HIGH (ref 0–5)
Specific Gravity, Urine: 1.009 (ref 1.005–1.030)
WBC, UA: >50 WBC/hpf — ABNORMAL HIGH (ref 0–5)
pH: 6 (ref 5.0–8.0)

## 2019-02-16 LAB — MAGNESIUM: Magnesium: 1.9 mg/dL (ref 1.7–2.4)

## 2019-02-16 LAB — MRSA PCR SCREENING: MRSA BY PCR: NEGATIVE

## 2019-02-16 MED ORDER — VANCOMYCIN HCL IN DEXTROSE 750-5 MG/150ML-% IV SOLN
750.0000 mg | INTRAVENOUS | Status: DC
  Administered 2019-02-17 – 2019-02-18 (×2): 750 mg via INTRAVENOUS
  Filled 2019-02-16 (×3): qty 150

## 2019-02-16 MED ORDER — VANCOMYCIN HCL 10 G IV SOLR
1500.0000 mg | Freq: Once | INTRAVENOUS | Status: AC
  Administered 2019-02-16: 1500 mg via INTRAVENOUS
  Filled 2019-02-16: qty 1500

## 2019-02-16 NOTE — Progress Notes (Signed)
[facility] - [place] at [facility]   PATIENT NAME: Tracey Fisher    MR#:  [id number]  DATE OF BIRTH:  Mar 30, 1966  SUBJECTIVE:  CHIEF COMPLAINT:   Chief Complaint  Patient presents with  . Shortness of Breath   This morning patient noted to have fevers with temperature of 101.4.  Still having cough and shortness of breath this morning.  Stat chest x-ray done.  Oxygen saturation noted to be 100% on 2 L.  REVIEW OF SYSTEMS:  Review of Systems  Constitutional: Positive for fever. Negative for chills.  HENT: Negative for hearing loss and tinnitus.   Eyes: Negative for blurred vision.  Respiratory: Positive for cough and sputum production. Negative for hemoptysis.   Cardiovascular: Negative for chest pain and palpitations.  Gastrointestinal: Negative for abdominal pain, heartburn, nausea and vomiting.  Genitourinary: Negative for dysuria and urgency.  Musculoskeletal: Negative for myalgias and neck pain.  Skin: Negative for itching and rash.  Neurological: Negative for headaches.  Psychiatric/Behavioral: Negative for depression and hallucinations.      DRUG ALLERGIES:   Allergies  Allergen Reactions  . Naproxen   . Tramadol Rash   VITALS:  Blood pressure 121/75, pulse 77, temperature 98.6 F (37 C), temperature source Oral, resp. rate 19, height 4\' 9"  (1.448 m), weight 63.5 kg, last menstrual period 01/14/2019, SpO2 100 %. PHYSICAL EXAMINATION:   Physical Exam  Constitutional: She is oriented to person, place, and time and well-developed, well-nourished, and in no distress.  HENT:  Head: Normocephalic and atraumatic.  Eyes: Pupils are equal, round, and reactive to light. Conjunctivae and EOM are normal.  Neck: Neck supple. No tracheal deviation present.  Cardiovascular: Normal rate, regular rhythm and normal heart sounds.  Pulmonary/Chest: Effort normal. She has wheezes.  Rhonchi in both upper lungs.  Abdominal: Soft. Bowel sounds are normal. There is  no abdominal tenderness.  Musculoskeletal: Normal range of motion.        General: No edema.  Neurological: She is alert and oriented to person, place, and time.  Skin: Skin is warm. She is not diaphoretic. No erythema.  Psychiatric: Affect normal.   LABORATORY PANEL:  Female CBC Recent Labs  Lab 02/16/19 0548  WBC 6.2  HGB 10.6*  HCT 33.2*  PLT 155   ------------------------------------------------------------------------------------------------------------------ Chemistries  Recent Labs  Lab 02/14/19 0811  02/16/19 0548  NA 134*   < > 131*  K 4.0   < > 3.9  CL 101   < > 97*  CO2 22   < > 28  GLUCOSE 151*   < > 73  BUN 10   < > 8  CREATININE 0.72   < > 0.58  CALCIUM 8.2*   < > 7.7*  MG  --   --  1.9  AST 116*  --   --   ALT 68*  --   --   ALKPHOS 65  --   --   BILITOT 1.0  --   --    < > = values in this interval not displayed.   RADIOLOGY:  Dg Chest 1 View  Result Date: 02/16/2019 CLINICAL DATA:  Shortness of breath in a cigarette smoker. EXAM: CHEST  1 VIEW COMPARISON:  Single-view of the chest 02/14/2019. PA and lateral chest 10/30/2009. FINDINGS: Patchy bilateral airspace disease is worst in the upper lobes on the right and left. No pneumothorax or pleural effusion. Heart size is upper normal. No acute or focal bony abnormality. IMPRESSION: Patchy bilateral airspace  disease most consistent with multifocal pneumonia. Electronically Signed   By: Drusilla Kanner M.D.   On: 02/16/2019 10:20   ASSESSMENT AND PLAN:    53 y/o female who presented to the ED with SOB and found to have CAP and Influenza A.  1. Sepsis : Patient here with tachycardia, tachypnea. Sepsis is from CAP and influenza. This morning patient noted to be having fevers with temperature of 101.4.  Repeat chest x-ray done this morning revealed patchy bilateral airspace disease worst in the upper lobes consistent with multifocal pneumonia. Patient currently on IV antibiotics with azithromycin and  Rocephin.  Also on Tamiflu. Added IV vancomycin for gram-positive coverage. Initial 1 out of 4 set of blood cultures growing staph species.  Most likely contaminant.  Repeat blood cultures so far with no growth.  2. Acute hypoxic respiratory failure due to  CAP, Left lower lobe: Continue Rocephin and Azithromycin Added vancomycin due to multifocal pneumonia with patient still having fevers this morning.  3. Influenza A: Tamiflu for a total of 5 days. Droplet Isolation   4. Tobacco dependence: Patient is encouraged to quit smoking. Counseling was provided for 4 minutes.  5. Polysubstance abuse: on methadone daily  DVT prophylaxis; Lovenox    All the records are reviewed and case discussed with Care Management/Social Worker. Management plans discussed with the patient, family and they are in agreement.  CODE STATUS: Full Code  TOTAL TIME TAKING CARE OF THIS PATIENT: 27 minutes.   More than 50% of the time was spent in counseling/coordination of care: YES  POSSIBLE D/C IN 2 DAYS, DEPENDING ON CLINICAL CONDITION.   Cheyene Hamric M.D on 02/16/2019 at 12:13 PM  Between 7am to 6pm - Pager - [phone]  After 6pm go to [email or web address] - Social research officer, government  [facility] [place] [facility]  Office  [phone]  CC: Primary care physician; Patient, No Pcp Per  Note: This dictation was prepared with Dragon dictation along with smaller phrase technology. Any transcriptional errors that result from this process are unintentional.

## 2019-02-16 NOTE — Progress Notes (Signed)
Offered to assist patient with a bath.  She said she would wash up later on and let us know when she is ready so we can change her linen and assist her.

## 2019-02-16 NOTE — Consult Note (Signed)
Pharmacy Antibiotic Note  Tracey Fisher is a 53 y.o. female admitted on 02/14/2019 with sepsis secondary to influenza and CAP.  Patient has been receiving ceftriaxone, azithromycin and Tamiflu. Pharmacy consulted for vancomycin due to worsening multiple focal PNA with persistent fevers.       Plan: Vancomycin 1500mg  IV x 1 dose  Start Vancomycin 750 mg IV Q 24 hrs. Goal AUC 400-550. Expected AUC: 503 SCr used: 0.8 BMI 30   Height: 4\' 9"  (144.8 cm) Weight: 140 lb (63.5 kg) IBW/kg (Calculated) : 38.6  Temp (24hrs), Avg:99.8 F (37.7 C), Min:98.6 F (37 C), Max:101.4 F (38.6 C)  Recent Labs  Lab 02/14/19 0811 02/15/19 0532 02/16/19 0548  WBC 8.7 12.5* 6.2  CREATININE 0.72 0.69 0.58    Estimated Creatinine Clearance: 64.5 mL/min (by C-G formula based on SCr of 0.58 mg/dL).    Allergies  Allergen Reactions  . Naproxen   . Tramadol Rash    Antimicrobials this admission: 2/21 azithromycin  >>  2/21 CTX>>  2/21 Tamiflu>> 2/23 vancomycin>>  Dose adjustments this admission:   Microbiology results: 2/21 BCx: Staph species 1 of 4   Thank you for allowing pharmacy to be a part of this patient's care.  Gardner Candle, PharmD, BCPS Clinical Pharmacist 02/16/2019 2:48 PM

## 2019-02-16 NOTE — Plan of Care (Signed)
  Problem: Clinical Measurements: Goal: Ability to maintain clinical measurements within normal limits will improve Outcome: Progressing Goal: Will remain free from infection Outcome: Progressing Goal: Diagnostic test results will improve Outcome: Progressing Goal: Respiratory complications will improve Outcome: Progressing Goal: Cardiovascular complication will be avoided Outcome: Progressing   Problem: Activity: Goal: Risk for activity intolerance will decrease Outcome: Progressing   Problem: Nutrition: Goal: Adequate nutrition will be maintained Outcome: Progressing   Problem: Elimination: Goal: Will not experience complications related to bowel motility Outcome: Progressing   Problem: Pain Managment: Goal: General experience of comfort will improve Outcome: Progressing   Problem: Skin Integrity: Goal: Risk for impaired skin integrity will decrease Outcome: Progressing   Problem: Activity: Goal: Ability to tolerate increased activity will improve Outcome: Progressing   Problem: Clinical Measurements: Goal: Ability to maintain a body temperature in the normal range will improve Outcome: Progressing   Problem: Respiratory: Goal: Ability to maintain adequate ventilation will improve Outcome: Progressing Goal: Ability to maintain a clear airway will improve Outcome: Progressing

## 2019-02-17 LAB — BASIC METABOLIC PANEL
Anion gap: 9 (ref 5–15)
BUN: 6 mg/dL (ref 6–20)
CALCIUM: 7.9 mg/dL — AB (ref 8.9–10.3)
CO2: 29 mmol/L (ref 22–32)
Chloride: 99 mmol/L (ref 98–111)
Creatinine, Ser: 0.52 mg/dL (ref 0.44–1.00)
GFR calc Af Amer: >60 mL/min (ref >60)
GFR calc non Af Amer: >60 mL/min (ref >60)
Glucose, Bld: 86 mg/dL (ref 70–99)
Potassium: 3.6 mmol/L (ref 3.5–5.1)
Sodium: 137 mmol/L (ref 135–145)

## 2019-02-17 LAB — CULTURE, BLOOD (ROUTINE X 2)

## 2019-02-17 LAB — CBC
HCT: 33.9 % — ABNORMAL LOW (ref 36.0–46.0)
Hemoglobin: 10.8 g/dL — ABNORMAL LOW (ref 12.0–15.0)
MCH: 29 pg (ref 26.0–34.0)
MCHC: 31.9 g/dL (ref 30.0–36.0)
MCV: 90.9 fL (ref 80.0–100.0)
Platelets: 157 10*3/uL (ref 150–400)
RBC: 3.73 MIL/uL — ABNORMAL LOW (ref 3.87–5.11)
RDW: 14.6 % (ref 11.5–15.5)
WBC: 5.8 10*3/uL (ref 4.0–10.5)
nRBC: 0 % (ref 0.0–0.2)

## 2019-02-17 LAB — MAGNESIUM: Magnesium: 2.1 mg/dL (ref 1.7–2.4)

## 2019-02-17 NOTE — Care Management (Signed)
Sent referral to [facility] and [facility].  The patient was provided the applications for both over the weekend.

## 2019-02-17 NOTE — Progress Notes (Signed)
[employer] [facility] - [place] at [facility]   PATIENT NAME: Tracey Fisher    MR#:  [id number]  DATE OF BIRTH:  Dec 01, 1966  SUBJECTIVE:  CHIEF COMPLAINT:   Chief Complaint  Patient presents with  . Shortness of Breath   Patient gradually improving.  Still feeling very sick and short of breath with minimal activity.  Patient reported having fevers overnight.  On review of the chart no documented fevers however.  REVIEW OF SYSTEMS:  Review of Systems  Constitutional: Positive for fever. Negative for chills.  HENT: Negative for hearing loss and tinnitus.   Eyes: Negative for blurred vision.  Respiratory: Positive for cough and sputum production. Negative for hemoptysis.   Cardiovascular: Negative for chest pain and palpitations.  Gastrointestinal: Negative for abdominal pain, heartburn, nausea and vomiting.  Genitourinary: Negative for dysuria and urgency.  Musculoskeletal: Negative for myalgias and neck pain.  Skin: Negative for itching and rash.  Neurological: Negative for headaches.  Psychiatric/Behavioral: Negative for depression and hallucinations.      DRUG ALLERGIES:   Allergies  Allergen Reactions  . Naproxen   . Tramadol Rash   VITALS:  Blood pressure (!) 162/86, pulse 71, temperature 98.5 F (36.9 C), temperature source Oral, resp. rate 18, height 4\' 9"  (1.448 m), weight 63.5 kg, last menstrual period 01/14/2019, SpO2 100 %. PHYSICAL EXAMINATION:   Physical Exam  Constitutional: She is oriented to person, place, and time and well-developed, well-nourished, and in no distress.  HENT:  Head: Normocephalic and atraumatic.  Eyes: Pupils are equal, round, and reactive to light. Conjunctivae and EOM are normal.  Neck: Neck supple. No tracheal deviation present.  Cardiovascular: Normal rate, regular rhythm and normal heart sounds.  Pulmonary/Chest: Effort normal. She has wheezes.  Rhonchi in both upper lungs.  Abdominal: Soft. Bowel sounds are normal.  There is no abdominal tenderness.  Musculoskeletal: Normal range of motion.        General: No edema.  Neurological: She is alert and oriented to person, place, and time.  Skin: Skin is warm. She is not diaphoretic. No erythema.  Psychiatric: Affect normal.   LABORATORY PANEL:  Female CBC Recent Labs  Lab 02/17/19 0806  WBC 5.8  HGB 10.8*  HCT 33.9*  PLT 157   ------------------------------------------------------------------------------------------------------------------ Chemistries  Recent Labs  Lab 02/14/19 0811  02/17/19 0806  NA 134*   < > 137  K 4.0   < > 3.6  CL 101   < > 99  CO2 22   < > 29  GLUCOSE 151*   < > 86  BUN 10   < > 6  CREATININE 0.72   < > 0.52  CALCIUM 8.2*   < > 7.9*  MG  --    < > 2.1  AST 116*  --   --   ALT 68*  --   --   ALKPHOS 65  --   --   BILITOT 1.0  --   --    < > = values in this interval not displayed.   RADIOLOGY:  No results found. ASSESSMENT AND PLAN:    53 y/o female who presented to the ED with SOB and found to have CAP and Influenza A.  1. Sepsis : Patient here with tachycardia, tachypnea. Sepsis is from CAP and influenza. Fevers appear to be improving.  Repeat recent chest x-ray done yesterday revealed patchy bilateral airspace disease worst in the upper lobes consistent with multifocal pneumonia. Patient still very short of breath  with minimal activity. Continue current broad-spectrum IV antibiotics with azithromycin and Rocephin in addition to vancomycin started yesterday.  Continue Tamiflu. Follow-up on blood cultures. Initial 1 out of 4 set of blood cultures growing staph species.  Most likely contaminant.  Repeat blood cultures so far with no growth.  Will de-escalate antibiotics in a.m. if patient shows clinical improvement. 2D echocardiogram ordered to evaluate cardiac function We will get physical therapy to ambulate patient in a.m.  2. Acute hypoxic respiratory failure due to  CAP, Left lower lobe: Continue  Rocephin and Azithromycin Continue broad-spectrum IV antibiotics for now with plans to de-escalate in a.m. if patient shows clinical improvement   3. Influenza A: Tamiflu for a total of 5 days. Droplet Isolation   4. Tobacco dependence: Patient is encouraged to quit smoking. Counseling was provided for 4 minutes.  5. Polysubstance abuse: on methadone daily  DVT prophylaxis; Lovenox    All the records are reviewed and case discussed with Care Management/Social Worker. Management plans discussed with the patient, family and they are in agreement.  CODE STATUS: Full Code  TOTAL TIME TAKING CARE OF THIS PATIENT: 26 minutes.   More than 50% of the time was spent in counseling/coordination of care: YES  POSSIBLE D/C IN 1-2 DAYS, DEPENDING ON CLINICAL CONDITION.   Claris Guymon M.D on 02/17/2019 at 12:28 PM  Between 7am to 6pm - Pager - [phone]  After 6pm go to [email or web address] - Social research officer, government  [facility] [place] [facility]  Office  [phone]  CC: Primary care physician; Patient, No Pcp Per  Note: This dictation was prepared with Dragon dictation along with smaller phrase technology. Any transcriptional errors that result from this process are unintentional.

## 2019-02-18 ENCOUNTER — Inpatient Hospital Stay
Admit: 2019-02-18 | Discharge: 2019-02-18 | Disposition: A | Discharge status: Home / Self Care | Payer: Self-pay | Attending: Internal Medicine | Admitting: Internal Medicine

## 2019-02-18 LAB — ECHOCARDIOGRAM COMPLETE
Height: 57 in
Weight: 2240 oz

## 2019-02-18 NOTE — Progress Notes (Signed)
[employer] - [place] at [facility]   PATIENT NAME: Tracey Fisher    MR#:  [id number]  DATE OF BIRTH:  1966/12/09  SUBJECTIVE:  CHIEF COMPLAINT:   Chief Complaint  Patient presents with  . Shortness of Breath   Patient gradually improving.  Patient had fevers with temperature of 101.2 yesterday.  Has remained afebrile overnight.  Still short of breath with minimal activity.  IV fluids discontinued.  2D echocardiogram done this morning.    REVIEW OF SYSTEMS:  Review of Systems  Constitutional: Negative for chills and fever.  HENT: Negative for hearing loss and tinnitus.   Eyes: Negative for blurred vision.  Respiratory: Positive for cough. Negative for hemoptysis and sputum production.   Cardiovascular: Negative for chest pain and palpitations.  Gastrointestinal: Negative for abdominal pain, heartburn, nausea and vomiting.  Genitourinary: Negative for dysuria and urgency.  Musculoskeletal: Negative for myalgias and neck pain.  Skin: Negative for itching and rash.  Neurological: Negative for headaches.  Psychiatric/Behavioral: Negative for depression and hallucinations.      DRUG ALLERGIES:   Allergies  Allergen Reactions  . Naproxen   . Tramadol Rash   VITALS:  Blood pressure (!) 163/92, pulse 72, temperature 98.4 F (36.9 C), temperature source Oral, resp. rate 19, height 4\' 9"  (1.448 m), weight 63.5 kg, last menstrual period 01/14/2019, SpO2 100 %. PHYSICAL EXAMINATION:   Physical Exam  Constitutional: She is oriented to person, place, and time and well-developed, well-nourished, and in no distress.  HENT:  Head: Normocephalic and atraumatic.  Eyes: Pupils are equal, round, and reactive to light. Conjunctivae and EOM are normal.  Neck: Neck supple. No tracheal deviation present.  Cardiovascular: Normal rate, regular rhythm and normal heart sounds.  Pulmonary/Chest: Effort normal. She has rales.  Rhonchi in both upper lungs.  Abdominal: Soft.  Bowel sounds are normal. There is no abdominal tenderness.  Musculoskeletal: Normal range of motion.        General: No edema.  Neurological: She is alert and oriented to person, place, and time.  Skin: Skin is warm. She is not diaphoretic. No erythema.  Psychiatric: Affect normal.   LABORATORY PANEL:  Female CBC Recent Labs  Lab 02/17/19 0806  WBC 5.8  HGB 10.8*  HCT 33.9*  PLT 157   ------------------------------------------------------------------------------------------------------------------ Chemistries  Recent Labs  Lab 02/14/19 0811  02/17/19 0806  NA 134*   < > 137  K 4.0   < > 3.6  CL 101   < > 99  CO2 22   < > 29  GLUCOSE 151*   < > 86  BUN 10   < > 6  CREATININE 0.72   < > 0.52  CALCIUM 8.2*   < > 7.9*  MG  --    < > 2.1  AST 116*  --   --   ALT 68*  --   --   ALKPHOS 65  --   --   BILITOT 1.0  --   --    < > = values in this interval not displayed.   RADIOLOGY:  No results found. ASSESSMENT AND PLAN:    53 y/o female who presented to the ED with SOB and found to have CAP and Influenza A.  1. Sepsis : Patient here with tachycardia, tachypnea. Sepsis is from CAP and influenza. Fevers appear to be improving.  Repeat recent chest x-ray done yesterday revealed patchy bilateral airspace disease worst in the upper lobes consistent with multifocal pneumonia. Patient  still very short of breath with minimal activity. Continue current broad-spectrum IV antibiotics with azithromycin and Rocephin in addition to vancomycin started yesterday.  Continue Tamiflu. Follow-up on blood cultures. Initial 1 out of 4 set of blood cultures growing staph species.  Most likely contaminant.  Repeat blood cultures so far with no growth.   2D echocardiogram done with ejection fraction of 60 to 65%.  Will de-escalate antibiotics in a.m. if patient shows clinical improvement. Physical therapy to evaluate and treat. 2. Acute hypoxic respiratory failure due to  CAP, Left lower lobe:  Continue Rocephin and Azithromycin Continue broad-spectrum IV antibiotics for now with plans to de-escalate in a.m. if patient shows clinical improvement   3. Influenza A: Tamiflu for a total of 5 days. Droplet Isolation   4. Tobacco dependence: Patient is encouraged to quit smoking. Counseling was provided for 4 minutes.  5. Polysubstance abuse: on methadone daily  DVT prophylaxis; Lovenox    All the records are reviewed and case discussed with Care Management/Social Worker. Management plans discussed with the patient, family and they are in agreement.  CODE STATUS: Full Code  TOTAL TIME TAKING CARE OF THIS PATIENT: 25 minutes.   More than 50% of the time was spent in counseling/coordination of care: YES  POSSIBLE D/C IN 1DAY, DEPENDING ON CLINICAL CONDITION.   Kayhan Boardley M.D on 02/18/2019 at 1:58 PM  Between 7am to 6pm - Pager - [phone]  After 6pm go to [email or web address] - Social research officer, government  [facility] [place] [facility]  Office  [phone]  CC: Primary care physician; Patient, No Pcp Per  Note: This dictation was prepared with Dragon dictation along with smaller phrase technology. Any transcriptional errors that result from this process are unintentional.

## 2019-02-18 NOTE — Progress Notes (Signed)
PT Cancellation Note  Patient Details Name: Tracey Fisher MRN: [id number] DOB: 01/03/1966   Cancelled Treatment:    Reason Eval/Treat Not Completed: Other (comment)(screen ). Pt screened and endorses she has no concerns, that she has been ambulating and performing bed mobility/transfers without assistance while during hospital stay, PTA was independent with all ADLs, was driving, and has had no falls in the past 6 months. Pt declines physical therapy. SPT educated pt to place chairs in every room in case she feels weak or SOB and if she has any further questions or concerns she may contact her RN for further therapy. PT to sign off at this date.    Emeline Gins, SPT  02/18/2019, 11:02 AM

## 2019-02-18 NOTE — Progress Notes (Signed)
Assumed care of patient at 1530. Patient alert and oriented. Medicated for pain x1. Up to bathroom and short of breath with exertion. 2L acutely. Tolerating antibiotics well.

## 2019-02-18 NOTE — Progress Notes (Signed)
*  PRELIMINARY RESULTS* Echocardiogram 2D Echocardiogram has been performed.  Tracey Fisher 02/18/2019, 8:55 AM

## 2019-02-19 LAB — BASIC METABOLIC PANEL
Anion gap: 7 (ref 5–15)
CHLORIDE: 100 mmol/L (ref 98–111)
CO2: 34 mmol/L — ABNORMAL HIGH (ref 22–32)
Calcium: 8.6 mg/dL — ABNORMAL LOW (ref 8.9–10.3)
Creatinine, Ser: 0.45 mg/dL (ref 0.44–1.00)
GFR calc Af Amer: >60 mL/min (ref >60)
GFR calc non Af Amer: >60 mL/min (ref >60)
Glucose, Bld: 86 mg/dL (ref 70–99)
Potassium: 3.4 mmol/L — ABNORMAL LOW (ref 3.5–5.1)
Sodium: 141 mmol/L (ref 135–145)

## 2019-02-19 LAB — CBC
HEMATOCRIT: 34 % — AB (ref 36.0–46.0)
Hemoglobin: 11.2 g/dL — ABNORMAL LOW (ref 12.0–15.0)
MCH: 29.3 pg (ref 26.0–34.0)
MCHC: 32.9 g/dL (ref 30.0–36.0)
MCV: 89 fL (ref 80.0–100.0)
Platelets: 196 10*3/uL (ref 150–400)
RBC: 3.82 MIL/uL — ABNORMAL LOW (ref 3.87–5.11)
RDW: 14.2 % (ref 11.5–15.5)
WBC: 5.7 10*3/uL (ref 4.0–10.5)
nRBC: 0 % (ref 0.0–0.2)

## 2019-02-19 LAB — CULTURE, BLOOD (ROUTINE X 2): Culture: NO GROWTH

## 2019-02-19 LAB — MAGNESIUM: Magnesium: 2.1 mg/dL (ref 1.7–2.4)

## 2019-02-19 MED ORDER — NITROGLYCERIN 2 % TD OINT
0.5000 [in_us] | TOPICAL_OINTMENT | Freq: Four times a day (QID) | TRANSDERMAL | Status: DC
  Administered 2019-02-19 (×2): 0.5 [in_us] via TOPICAL
  Filled 2019-02-19 (×3): qty 1

## 2019-02-19 MED ORDER — AMLODIPINE BESYLATE 5 MG PO TABS
5.0000 mg | ORAL_TABLET | Freq: Every day | ORAL | Status: DC
  Administered 2019-02-19: 5 mg via ORAL
  Filled 2019-02-19: qty 1

## 2019-02-19 MED ORDER — AMOXICILLIN-POT CLAVULANATE 875-125 MG PO TABS: 1.0000 | ORAL_TABLET | Freq: Two times a day (BID) | ORAL | 0 refills | Status: AC

## 2019-02-19 MED ORDER — HYDRALAZINE HCL 25 MG PO TABS: 25.0000 mg | ORAL_TABLET | Freq: Three times a day (TID) | ORAL | 0 refills | Status: AC

## 2019-02-19 MED ORDER — HYDRALAZINE HCL 25 MG PO TABS: 25.0000 mg | ORAL_TABLET | Freq: Three times a day (TID) | ORAL | Status: DC

## 2019-02-19 MED ORDER — AMLODIPINE BESYLATE 5 MG PO TABS: 5.0000 mg | ORAL_TABLET | Freq: Every day | ORAL | 0 refills | Status: AC

## 2019-02-19 MED ORDER — ACETAMINOPHEN 325 MG PO TABS: 650.0000 mg | ORAL_TABLET | Freq: Four times a day (QID) | ORAL | 0 refills | Status: AC | PRN

## 2019-02-19 MED ORDER — GUAIFENESIN-DM 100-10 MG/5ML PO SYRP: 5.0000 mL | ORAL_SOLUTION | ORAL | 0 refills | Status: AC | PRN

## 2019-02-19 MED ORDER — POTASSIUM CHLORIDE CRYS ER 20 MEQ PO TBCR: 40.0000 meq | EXTENDED_RELEASE_TABLET | Freq: Once | ORAL | Status: DC

## 2019-02-19 NOTE — Care Management (Signed)
Tracey Fisher with [facility] will have someone call the patient to get set up for PCP, MM Milagros Reap notified me thru Secure inbox that they will be able to help with DC meds

## 2019-02-19 NOTE — Discharge Summary (Signed)
[employer] - [place] at [facility]   PATIENT NAME: Tracey Fisher    MR#:  [id number]  DATE OF BIRTH:  05-17-1966  DATE OF ADMISSION:  02/14/2019   ADMITTING PHYSICIAN: Adrian Saran, MD  DATE OF DISCHARGE: 02/19/2019  PRIMARY CARE PHYSICIAN: Patient, No Pcp Per   ADMISSION DIAGNOSIS:  Influenza [J11.1] Dyspnea, unspecified type [R06.00] Community acquired pneumonia of left lung, unspecified part of lung [J18.9] DISCHARGE DIAGNOSIS:  Active Problems:   Sepsis (HCC)  SECONDARY DIAGNOSIS:  History reviewed. No pertinent past medical history. HOSPITAL COURSE:  Chief complaint; shortness of breath  History of presenting complaint [name]  is a 53 y.o. female with a known history of polysubstancea abuse who presented to the ED with SOB, cough and malaise. Her symptoms have been present for 2 days however worse over the last 24 hours. She tested positive for the flu and CXR show PNA.She was started on antibiotics and tamiflu as well as given breathing treatments for audible wheezing, patient was requiring supplemental oxygen.  Was admitted to medical service for further evaluation.  Please refer to the H&P for further details.   Hospital course; 1. Sepsis : Patient here with tachycardia, tachypnea. Sepsis is from CAP and influenza.  Patient was treated with broad-spectrum IV antibiotics including IV azithromycin and Rocephin initially.  Vancomycin later added due to persistent fevers.  Patient was also started on Tamiflu.  Significantly improved clinically.  Already weaned off oxygen with oxygen saturation of 100% on room air.  Remains afebrile.  Physical therapy consulted to evaluate patient and patient stated she was able to ambulate independently.  So far no growth on cultures. 2D echocardiogram done with ejection fraction of 60 to 65%.   Patient clinically and hemodynamically stable.  Being discharged on p.o. Augmentin to complete treatment duration.  2. Acute  hypoxic respiratory failure due to CAP, Left lower lobe:  Adequately treated with IV antibiotics.  Already weaned off supplemental oxygen.  Oxygen saturation of 100% on room air.   3. Influenza A: Patient completed 5-day treatment duration with Tamiflu.  4.Tobacco dependence: Patient is encouraged to quit smoking. Counseling was provided for 4 minutes.  5. Polysubstance abuse:on methadone daily   DISCHARGE CONDITIONS:  Stable CONSULTS OBTAINED:   DRUG ALLERGIES:   Allergies  Allergen Reactions  . Naproxen   . Tramadol Rash   DISCHARGE MEDICATIONS:   Allergies as of 02/19/2019      Reactions   Naproxen    Tramadol Rash      Medication List    TAKE these medications   acetaminophen 325 MG tablet Commonly known as:  TYLENOL Take 2 tablets (650 mg total) by mouth every 6 (six) hours as needed for mild pain or fever (or Fever >/= 101).   amLODipine 5 MG tablet Commonly known as:  NORVASC Take 1 tablet (5 mg total) by mouth daily. Start taking on:  February 20, 2019   amoxicillin-clavulanate 875-125 MG tablet Commonly known as:  AUGMENTIN Take 1 tablet by mouth every 12 (twelve) hours for 4 days.   guaiFENesin-dextromethorphan 100-10 MG/5ML syrup Commonly known as:  ROBITUSSIN DM Take 5 mLs by mouth every 4 (four) hours as needed for up to 4 days for cough (chest congestion).   hydrALAZINE 25 MG tablet Commonly known as:  APRESOLINE Take 1 tablet (25 mg total) by mouth 3 (three) times daily.   methadone 10 MG/ML solution Commonly known as:  DOLOPHINE Take 40 mg by mouth daily.  DISCHARGE INSTRUCTIONS:   DIET:  Cardiac diet DISCHARGE CONDITION:  Stable ACTIVITY:  Activity as tolerated OXYGEN:  Home Oxygen: No.  Oxygen Delivery: room air DISCHARGE LOCATION:  home   If you experience worsening of your admission symptoms, develop shortness of breath, life threatening emergency, suicidal or homicidal thoughts you must seek medical attention  immediately by calling 911 or calling your MD immediately  if symptoms less severe.  You Must read complete instructions/literature along with all the possible adverse reactions/side effects for all the Medicines you take and that have been prescribed to you. Take any new Medicines after you have completely understood and accpet all the possible adverse reactions/side effects.   Please note  You were cared for by a hospitalist during your hospital stay. If you have any questions about your discharge medications or the care you received while you were in the hospital after you are discharged, you can call the unit and asked to speak with the hospitalist on call if the hospitalist that took care of you is not available. Once you are discharged, your primary care physician will handle any further medical issues. Please note that NO REFILLS for any discharge medications will be authorized once you are discharged, as it is imperative that you return to your primary care physician (or establish a relationship with a primary care physician if you do not have one) for your aftercare needs so that they can reassess your need for medications and monitor your lab values.    On the day of Discharge:  VITAL SIGNS:  Blood pressure (!) 159/94, pulse 60, temperature 98.2 F (36.8 C), temperature source Oral, resp. rate 18, height 4\' 9"  (1.448 m), weight 63.5 kg, last menstrual period 01/14/2019, SpO2 100 %. PHYSICAL EXAMINATION:  GENERAL:  53 y.o.-year-old patient lying in the bed with no acute distress.  EYES: Pupils equal, round, reactive to light and accommodation. No scleral icterus. Extraocular muscles intact.  HEENT: Head atraumatic, normocephalic. Oropharynx and nasopharynx clear.  NECK:  Supple, no jugular venous distention. No thyroid enlargement, no tenderness.  LUNGS: Normal breath sounds bilaterally, no wheezing, rales,rhonchi or crepitation. No use of accessory muscles of respiration.    CARDIOVASCULAR: S1, S2 normal. No murmurs, rubs, or gallops.  ABDOMEN: Soft, non-tender, non-distended. Bowel sounds present. No organomegaly or mass.  EXTREMITIES: No pedal edema, cyanosis, or clubbing.  NEUROLOGIC: Cranial nerves II through XII are intact. Muscle strength 5/5 in all extremities. Sensation intact. Gait not checked.  PSYCHIATRIC: The patient is alert and oriented x 3.  SKIN: No obvious rash, lesion, or ulcer.  DATA REVIEW:   CBC Recent Labs  Lab 02/19/19 0720  WBC 5.7  HGB 11.2*  HCT 34.0*  PLT 196    Chemistries  Recent Labs  Lab 02/14/19 0811  02/19/19 0720  NA 134*   < > 141  K 4.0   < > 3.4*  CL 101   < > 100  CO2 22   < > 34*  GLUCOSE 151*   < > 86  BUN 10   < > <5*  CREATININE 0.72   < > 0.45  CALCIUM 8.2*   < > 8.6*  MG  --    < > 2.1  AST 116*  --   --   ALT 68*  --   --   ALKPHOS 65  --   --   BILITOT 1.0  --   --    < > = values in this interval not  displayed.     Microbiology Results  Results for orders placed or performed during the hospital encounter of 02/14/19  Blood culture (routine x 2)     Status: None   Collection Time: 02/14/19  9:00 AM  Result Value Ref Range Status   Specimen Description BLOOD AC  Final   Special Requests   Final    BOTTLES DRAWN AEROBIC AND ANAEROBIC Blood Culture results may not be optimal due to an excessive volume of blood received in culture bottles   Culture   Final    NO GROWTH 5 DAYS Performed at [facility], [address], [place], [place] [address]    Report Status 02/19/2019 FINAL  Final  Blood culture (routine x 2)     Status: Abnormal   Collection Time: 02/14/19  9:07 AM  Result Value Ref Range Status   Specimen Description   Final    BLOOD RIGHT ANTECUBITAL Performed at [facility], [address] [place], [place] [address]    Special Requests   Final    BOTTLES DRAWN AEROBIC AND ANAEROBIC Blood Culture results may not be optimal due to an excessive volume of blood  received in culture bottles Performed at [facility], [address] [place], [place] [address]    Culture  Setup Time   Final    GRAM POSITIVE COCCI AEROBIC BOTTLE ONLY CRITICAL RESULT CALLED TO, READ BACK BY AND VERIFIED WITHCher Nakai AT 9147 02/15/19 SDR Performed at [facility], [address], [place], [place] [address]    Culture (A)  Final    STAPHYLOCOCCUS SPECIES (COAGULASE NEGATIVE) THE SIGNIFICANCE OF ISOLATING THIS ORGANISM FROM A SINGLE SET OF BLOOD CULTURES WHEN MULTIPLE SETS ARE DRAWN IS UNCERTAIN. PLEASE NOTIFY THE MICROBIOLOGY DEPARTMENT WITHIN ONE WEEK IF SPECIATION AND SENSITIVITIES ARE REQUIRED.    Report Status 02/17/2019 FINAL  Final  Blood Culture ID Panel (Reflexed)     Status: Abnormal   Collection Time: 02/14/19  9:07 AM  Result Value Ref Range Status   Enterococcus species NOT DETECTED NOT DETECTED Final   Listeria monocytogenes NOT DETECTED NOT DETECTED Final   Staphylococcus species DETECTED (A) NOT DETECTED Final    Comment: Methicillin (oxacillin) susceptible coagulase negative staphylococcus. Possible blood culture contaminant (unless isolated from more than one blood culture draw or clinical case suggests pathogenicity). No antibiotic treatment is indicated for blood  culture contaminants. CRITICAL RESULT CALLED TO, READ BACK BY AND VERIFIED WITH: SHEEMA HALLAJI AT 2130 02/15/19 SDR    Staphylococcus aureus (BCID) NOT DETECTED NOT DETECTED Final   Methicillin resistance NOT DETECTED NOT DETECTED Final   Streptococcus species NOT DETECTED NOT DETECTED Final   Streptococcus agalactiae NOT DETECTED NOT DETECTED Final   Streptococcus pneumoniae NOT DETECTED NOT DETECTED Final   Streptococcus pyogenes NOT DETECTED NOT DETECTED Final   Acinetobacter baumannii NOT DETECTED NOT DETECTED Final   Enterobacteriaceae species NOT DETECTED NOT DETECTED Final   Enterobacter cloacae complex NOT DETECTED NOT DETECTED Final   Escherichia coli  NOT DETECTED NOT DETECTED Final   Klebsiella oxytoca NOT DETECTED NOT DETECTED Final   Klebsiella pneumoniae NOT DETECTED NOT DETECTED Final   Proteus species NOT DETECTED NOT DETECTED Final   Serratia marcescens NOT DETECTED NOT DETECTED Final   Haemophilus influenzae NOT DETECTED NOT DETECTED Final   Neisseria meningitidis NOT DETECTED NOT DETECTED Final   Pseudomonas aeruginosa NOT DETECTED NOT DETECTED Final   Candida albicans NOT DETECTED NOT DETECTED Final  Candida glabrata NOT DETECTED NOT DETECTED Final   Candida krusei NOT DETECTED NOT DETECTED Final   Candida parapsilosis NOT DETECTED NOT DETECTED Final   Candida tropicalis NOT DETECTED NOT DETECTED Final    Comment: Performed at [facility], [address] [place], [place] [address]  CULTURE, BLOOD (ROUTINE X 2) w Reflex to ID Panel     Status: None (Preliminary result)   Collection Time: 02/15/19  9:59 AM  Result Value Ref Range Status   Specimen Description BLOOD BLOOD LEFT HAND  Final   Special Requests   Final    BOTTLES DRAWN AEROBIC AND ANAEROBIC Blood Culture adequate volume   Culture   Final    NO GROWTH 4 DAYS Performed at [facility], [address] [place], [place] [address]    Report Status PENDING  Incomplete  CULTURE, BLOOD (ROUTINE X 2) w Reflex to ID Panel     Status: None (Preliminary result)   Collection Time: 02/15/19 10:57 AM  Result Value Ref Range Status   Specimen Description BLOOD LEFT HAND  Final   Special Requests   Final    BOTTLES DRAWN AEROBIC AND ANAEROBIC Blood Culture adequate volume   Culture   Final    NO GROWTH 4 DAYS Performed at [facility], [address], [place], [place] [address]    Report Status PENDING  Incomplete  MRSA PCR Screening     Status: None   Collection Time: 02/16/19  2:58 PM  Result Value Ref Range Status   MRSA by PCR NEGATIVE NEGATIVE Final    Comment:        The GeneXpert MRSA Assay (FDA approved for NASAL  specimens only), is one component of a comprehensive MRSA colonization surveillance program. It is not intended to diagnose MRSA infection nor to guide or monitor treatment for MRSA infections. Performed at [facility], [address], [place], [place] [address]     RADIOLOGY:  No results found.   Management plans discussed with the patient, family and they are in agreement.  CODE STATUS: Full Code   TOTAL TIME TAKING CARE OF THIS PATIENT: 36 minutes.    Willie Plain M.D on 02/19/2019 at 10:49 AM  Between 7am to 6pm - Pager - [phone]  After 6pm go to [email or web address] - Social research officer, government  [facility] [place] [facility]  Office  [phone]  CC: Primary care physician; Patient, No Pcp Per   Note: This dictation was prepared with Dragon dictation along with smaller phrase technology. Any transcriptional errors that result from this process are unintentional.

## 2019-02-19 NOTE — Progress Notes (Signed)
Patient is being discharged. Patient c/o not being able to go home on the Norco.  Notified Dr. Enid Baas. MD is not sending a patient home with Norco. Explained this to patient and she was extremely unhappy about this.  Patient is taking Methadone. IV removed with cath intact.  94% on room air.

## 2019-02-20 LAB — CULTURE, BLOOD (ROUTINE X 2)
Culture: NO GROWTH
Culture: NO GROWTH
SPECIAL REQUESTS: ADEQUATE
Special Requests: ADEQUATE

## 2020-02-26 ENCOUNTER — Emergency Department
Admission: EM | Admit: 2020-02-26 | Discharge: 2020-02-26 | Disposition: A | Discharge status: Home / Self Care | Payer: Self-pay | Attending: Emergency Medicine | Admitting: Emergency Medicine

## 2020-02-26 ENCOUNTER — Other Ambulatory Visit: Payer: Self-pay

## 2020-02-26 DIAGNOSIS — Z79899 Other long term (current) drug therapy: Secondary | ICD-10-CM | POA: Insufficient documentation

## 2020-02-26 DIAGNOSIS — F1020 Alcohol dependence, uncomplicated: Secondary | ICD-10-CM | POA: Diagnosis present

## 2020-02-26 DIAGNOSIS — F1123 Opioid dependence with withdrawal: Secondary | ICD-10-CM | POA: Insufficient documentation

## 2020-02-26 DIAGNOSIS — F19939 Other psychoactive substance use, unspecified with withdrawal, unspecified: Secondary | ICD-10-CM | POA: Diagnosis present

## 2020-02-26 DIAGNOSIS — F17210 Nicotine dependence, cigarettes, uncomplicated: Secondary | ICD-10-CM | POA: Insufficient documentation

## 2020-02-26 DIAGNOSIS — F1120 Opioid dependence, uncomplicated: Secondary | ICD-10-CM | POA: Diagnosis present

## 2020-02-26 DIAGNOSIS — F329 Major depressive disorder, single episode, unspecified: Secondary | ICD-10-CM | POA: Insufficient documentation

## 2020-02-26 DIAGNOSIS — F1193 Opioid use, unspecified with withdrawal: Secondary | ICD-10-CM

## 2020-02-26 DIAGNOSIS — R45851 Suicidal ideations: Secondary | ICD-10-CM | POA: Insufficient documentation

## 2020-02-26 DIAGNOSIS — A419 Sepsis, unspecified organism: Secondary | ICD-10-CM | POA: Diagnosis present

## 2020-02-26 DIAGNOSIS — Z046 Encounter for general psychiatric examination, requested by authority: Secondary | ICD-10-CM | POA: Insufficient documentation

## 2020-02-26 DIAGNOSIS — F1420 Cocaine dependence, uncomplicated: Secondary | ICD-10-CM | POA: Diagnosis present

## 2020-02-26 LAB — CBC
HCT: 40.2 % (ref 36.0–46.0)
Hemoglobin: 13.4 g/dL (ref 12.0–15.0)
MCH: 29.6 pg (ref 26.0–34.0)
MCHC: 33.3 g/dL (ref 30.0–36.0)
MCV: 88.7 fL (ref 80.0–100.0)
Platelets: 255 10*3/uL (ref 150–400)
RBC: 4.53 MIL/uL (ref 3.87–5.11)
RDW: 13.4 % (ref 11.5–15.5)
WBC: 8.5 10*3/uL (ref 4.0–10.5)
nRBC: 0 % (ref 0.0–0.2)

## 2020-02-26 LAB — URINE DRUG SCREEN, QUALITATIVE (ARMC ONLY)
Amphetamines, Ur Screen: NOT DETECTED
Barbiturates, Ur Screen: NOT DETECTED
Benzodiazepine, Ur Scrn: NOT DETECTED
Cannabinoid 50 Ng, Ur ~~LOC~~: POSITIVE — AB
Cocaine Metabolite,Ur ~~LOC~~: NOT DETECTED
MDMA (Ecstasy)Ur Screen: NOT DETECTED
Methadone Scn, Ur: NOT DETECTED
Opiate, Ur Screen: POSITIVE — AB
Phencyclidine (PCP) Ur S: NOT DETECTED
Tricyclic, Ur Screen: NOT DETECTED

## 2020-02-26 LAB — COMPREHENSIVE METABOLIC PANEL
ALT: 14 U/L (ref 0–44)
AST: 30 U/L (ref 15–41)
Albumin: 4.6 g/dL (ref 3.5–5.0)
Alkaline Phosphatase: 81 U/L (ref 38–126)
Anion gap: 9 (ref 5–15)
BUN: 12 mg/dL (ref 6–20)
CO2: 27 mmol/L (ref 22–32)
Calcium: 9.5 mg/dL (ref 8.9–10.3)
Chloride: 101 mmol/L (ref 98–111)
Creatinine, Ser: 0.78 mg/dL (ref 0.44–1.00)
GFR calc Af Amer: >60 mL/min (ref >60)
GFR calc non Af Amer: >60 mL/min (ref >60)
Glucose, Bld: 107 mg/dL — ABNORMAL HIGH (ref 70–99)
Potassium: 3.4 mmol/L — ABNORMAL LOW (ref 3.5–5.1)
Sodium: 137 mmol/L (ref 135–145)
Total Bilirubin: 0.4 mg/dL (ref 0.3–1.2)
Total Protein: 8 g/dL (ref 6.5–8.1)

## 2020-02-26 LAB — ACETAMINOPHEN LEVEL: Acetaminophen (Tylenol), Serum: <10 ug/mL — ABNORMAL LOW (ref 10–30)

## 2020-02-26 LAB — POCT PREGNANCY, URINE: Preg Test, Ur: NEGATIVE

## 2020-02-26 LAB — SALICYLATE LEVEL: Salicylate Lvl: <7 mg/dL — ABNORMAL LOW (ref 7.0–30.0)

## 2020-02-26 LAB — ETHANOL: Alcohol, Ethyl (B): <10 mg/dL (ref <10)

## 2020-02-26 MED ORDER — CLONIDINE HCL 0.1 MG PO TABS
0.1000 mg | ORAL_TABLET | Freq: Once | ORAL | Status: AC
  Administered 2020-02-26: 0.1 mg via ORAL
  Filled 2020-02-26: qty 1

## 2020-02-26 MED ORDER — ONDANSETRON 4 MG PO TBDP
4.0000 mg | ORAL_TABLET | Freq: Once | ORAL | Status: AC
  Administered 2020-02-26: 4 mg via ORAL
  Filled 2020-02-26: qty 1

## 2020-02-26 MED ORDER — NICOTINE 21 MG/24HR TD PT24
21.0000 mg | MEDICATED_PATCH | Freq: Once | TRANSDERMAL | Status: DC
  Administered 2020-02-26: 19:00:00 21 mg via TRANSDERMAL
  Filled 2020-02-26: qty 1

## 2020-02-26 NOTE — ED Notes (Signed)
Pt noted to be sleeping calmly at this time.  Chest rise even and unlabored.  Will continue to round and monitor on patient.
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
## 2020-02-26 NOTE — ED Triage Notes (Signed)
Pt comes POV and states that her depression is getting bad. Pt states that she is coming off drugs and she keeps getting bad thoughts and coming up with ways to kill herself such as getting in front of a car. Drug of choice is heroin. Pt checking in with daughter for same thing. Her advocate told her to come here to the ER.

## 2020-02-26 NOTE — ED Notes (Signed)
Belongings returned to patient.  Patient dressing back into clothes.  Discharge paperwork reviewed and signature obtained.  Patient does not have any more questions. Is calm and cooperative upon discharge and will be shown the way out to lobby.

## 2020-02-26 NOTE — ED Notes (Signed)
Pt escorted from quad to BHU by wheelchair by this RN and security.  Ambulatory with steady gait into room.  Patient introduced to her room and common area, shown where the bathroom is.  Offered snack, drink, and blankets.  Patient is resting calmly in bed at this time with tv on and denies other needs at this time.  Will continue to round on patient.

## 2020-02-26 NOTE — ED Notes (Addendum)
Pt belongings include one pair leggings, two socks, two boots, one tshirt, one jacket, one bra, two silver colored earrings and one colored ring, one estrogen supplement pack, purse with phone and wallet.
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
## 2020-02-26 NOTE — ED Notes (Signed)
IVC, pending consult 

## 2020-02-26 NOTE — ED Notes (Signed)
Psychiatry team at bedside assessing patient at this time.

## 2020-06-16 IMAGING — DX DG CHEST 1V PORT
1 series · 1 of 1 positions shown · non-contrast
Comparison: 10/30/2009

CLINICAL DATA: Shortness of breath

EXAM:
PORTABLE CHEST 1 VIEW

[chest ap]
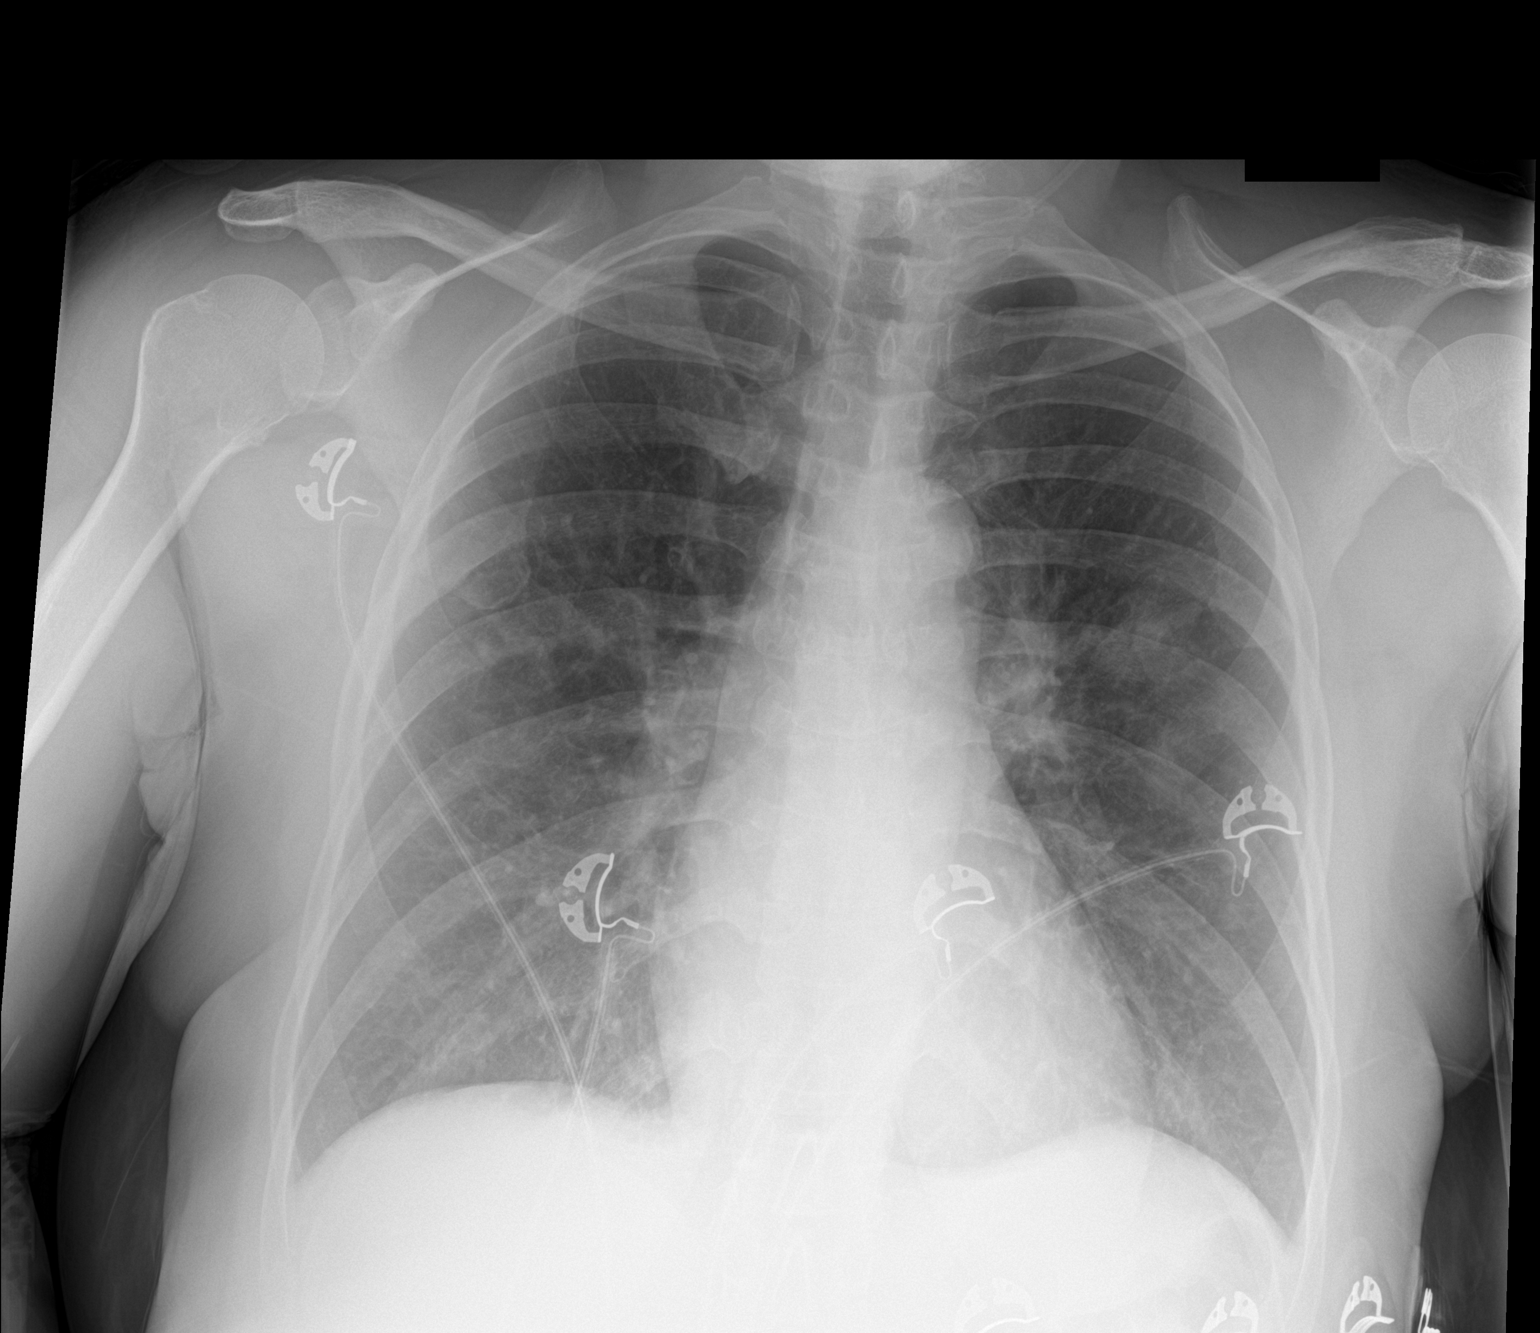

[1 of 1 positions shown; findings below may reference images not displayed]

FINDINGS: Mild patchy density over the left mid lung. Normal heart size. No
Kerley lines or effusion. No pneumothorax.
IMPRESSION: Mild patchy density on the left, most likely pneumonia in the
appropriate clinical setting.

## 2021-02-25 ENCOUNTER — Emergency Department
Admission: EM | Admit: 2021-02-25 | Discharge: 2021-02-25 | Disposition: A | Discharge status: Home / Self Care | Payer: Self-pay | Attending: Emergency Medicine | Admitting: Emergency Medicine

## 2021-02-25 ENCOUNTER — Encounter: Payer: Self-pay | Admitting: Emergency Medicine

## 2021-02-25 ENCOUNTER — Other Ambulatory Visit: Payer: Self-pay

## 2021-02-25 DIAGNOSIS — R42 Dizziness and giddiness: Secondary | ICD-10-CM

## 2021-02-25 DIAGNOSIS — E860 Dehydration: Secondary | ICD-10-CM | POA: Insufficient documentation

## 2021-02-25 DIAGNOSIS — F17210 Nicotine dependence, cigarettes, uncomplicated: Secondary | ICD-10-CM | POA: Insufficient documentation

## 2021-02-25 LAB — COMPREHENSIVE METABOLIC PANEL
ALT: 25 U/L (ref 0–44)
AST: 34 U/L (ref 15–41)
Albumin: 4.4 g/dL (ref 3.5–5.0)
Alkaline Phosphatase: 104 U/L (ref 38–126)
Anion gap: 10 (ref 5–15)
BUN: 19 mg/dL (ref 6–20)
CO2: 28 mmol/L (ref 22–32)
Calcium: 9.7 mg/dL (ref 8.9–10.3)
Chloride: 100 mmol/L (ref 98–111)
Creatinine, Ser: 0.7 mg/dL (ref 0.44–1.00)
GFR, Estimated: >60 mL/min (ref >60)
Glucose, Bld: 106 mg/dL — ABNORMAL HIGH (ref 70–99)
Potassium: 3.7 mmol/L (ref 3.5–5.1)
Sodium: 138 mmol/L (ref 135–145)
Total Bilirubin: 0.5 mg/dL (ref 0.3–1.2)
Total Protein: 8 g/dL (ref 6.5–8.1)

## 2021-02-25 LAB — CBC
HCT: 40.6 % (ref 36.0–46.0)
Hemoglobin: 13.5 g/dL (ref 12.0–15.0)
MCH: 28.9 pg (ref 26.0–34.0)
MCHC: 33.3 g/dL (ref 30.0–36.0)
MCV: 86.9 fL (ref 80.0–100.0)
Platelets: 258 10*3/uL (ref 150–400)
RBC: 4.67 MIL/uL (ref 3.87–5.11)
RDW: 13.7 % (ref 11.5–15.5)
WBC: 7.5 10*3/uL (ref 4.0–10.5)
nRBC: 0 % (ref 0.0–0.2)

## 2021-02-25 LAB — TROPONIN I (HIGH SENSITIVITY): Troponin I (High Sensitivity): 3 ng/L (ref <18)

## 2021-02-25 MED ORDER — VENLAFAXINE HCL ER 37.5 MG PO CP24: 37.5000 mg | ORAL_CAPSULE | Freq: Every day | ORAL | 0 refills | Status: AC

## 2021-02-25 MED ORDER — ONDANSETRON HCL 4 MG/2ML IJ SOLN
4.0000 mg | Freq: Once | INTRAMUSCULAR | Status: DC
  Filled 2021-02-25: qty 2

## 2021-02-25 MED ORDER — LACTATED RINGERS IV BOLUS
1000.0000 mL | Freq: Once | INTRAVENOUS | Status: AC
  Administered 2021-02-25: 1000 mL via INTRAVENOUS

## 2021-02-25 MED ORDER — ATORVASTATIN CALCIUM 20 MG PO TABS: 20.0000 mg | ORAL_TABLET | Freq: Every day | ORAL | 0 refills | Status: AC

## 2021-02-25 MED ORDER — VENLAFAXINE HCL ER 37.5 MG PO CP24
37.5000 mg | ORAL_CAPSULE | ORAL | Status: AC
  Administered 2021-02-25: 37.5 mg via ORAL
  Filled 2021-02-25 (×2): qty 1

## 2021-02-25 NOTE — ED Provider Notes (Signed)
[facility] Emergency Department Provider Note  ____________________________________________  Time seen: Approximately 10:49 PM  I have reviewed the triage vital signs and the nursing notes.   HISTORY  Chief Complaint Dizziness    HPI Tracey Fisher is a 55 y.o. female with a history of substance abuse  who comes ED complaining of feeling of dizziness and motion is worse with change in position, turning her head, and standing this pain ongoing for the last 5 days.  No headache or vision change, no paresthesia or motor weakness.  It is waxing and waning, no alleviating factors.  She is eating and drinking, but does not drink a lot of fluids.  Diet consists of significant high fat foods, red meat.  Patient has been on venlafaxine and atorvastatin but ran out 5 days ago.  Reports increase in sweats and hot cold flashes.  No falls or trauma.     History reviewed. No pertinent past medical history.   Patient Active Problem List   Diagnosis Date Noted  . Sepsis (HCC) 02/14/2019  . Opioid use disorder, severe, dependence (HCC) 09/15/2018  . Alcohol use disorder, moderate, dependence (HCC) 09/15/2018  . Cocaine use disorder, moderate, dependence (HCC) 09/15/2018  . Drug withdrawal (HCC) 09/14/2018     History reviewed. No pertinent surgical history.   Prior to Admission medications   Medication Sig Start Date End Date Taking? Authorizing Provider  atorvastatin (LIPITOR) 20 MG tablet Take 1 tablet (20 mg total) by mouth daily. 02/25/21 05/26/21 Yes Sharman Cheek, MD  venlafaxine XR (EFFEXOR XR) 37.5 MG 24 hr capsule Take 1 capsule (37.5 mg total) by mouth daily with breakfast. After one week, you may increase the dose to 2 capsules daily with breakfast if needed. 02/25/21 04/26/21 Yes Sharman Cheek, MD  acetaminophen (TYLENOL) 325 MG tablet Take 2 tablets (650 mg total) by mouth every 6 (six) hours as needed for mild pain or fever (or Fever >/= 101). 02/19/19    Ojie, Jude, MD  amLODipine (NORVASC) 5 MG tablet Take 1 tablet (5 mg total) by mouth daily. 02/20/19   Enid Baas Jude, MD  hydrALAZINE (APRESOLINE) 25 MG tablet Take 1 tablet (25 mg total) by mouth 3 (three) times daily. 02/19/19   Enid Baas Jude, MD  methadone (DOLOPHINE) 10 MG/ML solution Take 40 mg by mouth daily.    [provider]  Rhubarb (ESTROVEN MENOPAUSE RELIEF PO) Take 1 capsule by mouth every morning.    [provider]     Allergies Naproxen and Tramadol   Family History  Adopted: Yes  Problem Relation Age of Onset  . Hypertension Mother     Social History Social History   Tobacco Use  . Smoking status: Current Every Day Smoker    Packs/day: 1.00    Types: Cigarettes  . Smokeless tobacco: Never Used  Substance Use Topics  . Alcohol use: No  . Drug use: Yes    Types: Marijuana, "Crack" cocaine    Review of Systems  Constitutional:   No fever or chills.  ENT:   No sore throat. No rhinorrhea. Cardiovascular:   No chest pain or syncope. Respiratory:   No dyspnea or cough. Gastrointestinal:   Negative for abdominal pain, vomiting and diarrhea.  Musculoskeletal:   Negative for focal pain or swelling All other systems reviewed and are negative except as documented above in ROS and HPI.  ____________________________________________   PHYSICAL EXAM:  VITAL SIGNS: ED Triage Vitals  Enc Vitals Group     BP  02/25/21 1826 (!) 146/103     Pulse Rate 02/25/21 1826 90     Resp 02/25/21 1826 20     Temp 02/25/21 1826 98.5 F (36.9 C)     Temp Source 02/25/21 1826 Oral     SpO2 02/25/21 1826 99 %     Weight 02/25/21 1824 120 lb (54.4 kg)     Height 02/25/21 1824 4\' 9"  (1.448 m)     Head Circumference --      Peak Flow --      Pain Score 02/25/21 1824 0     Pain Loc --      Pain Edu? --      Excl. in GC? --     Vital signs reviewed, nursing assessments reviewed.   Constitutional:   Alert and oriented. Non-toxic appearance. Eyes:   Conjunctivae  are normal. EOMI. PERRL. ENT      Head:   Normocephalic and atraumatic.      Nose:   Normal.      Mouth/Throat:   Moist mucosa.      Neck:   No meningismus. Full ROM. Hematological/Lymphatic/Immunilogical:   No cervical lymphadenopathy. Cardiovascular:   RRR. Symmetric bilateral radial and DP pulses.  No murmurs. Cap refill less than 2 seconds.  Heart rate increases from 75-95 going from semirecumbent to standing Respiratory:   Normal respiratory effort without tachypnea/retractions. Breath sounds are clear and equal bilaterally. No wheezes/rales/rhonchi. Gastrointestinal:   Soft and nontender. Non distended. There is no CVA tenderness.  No rebound, rigidity, or guarding.  Musculoskeletal:   Normal range of motion in all extremities. No joint effusions.  No lower extremity tenderness.  No edema. Neurologic:   Normal speech and language.  Normal gait, normal balance Motor grossly intact. No acute focal neurologic deficits are appreciated.  Skin:    Skin is warm, dry and intact. No rash noted.  No petechiae, purpura, or bullae.  ____________________________________________    LABS (pertinent positives/negatives) (all labs ordered are listed, but only abnormal results are displayed) Labs Reviewed  COMPREHENSIVE METABOLIC PANEL - Abnormal; Notable for the following components:      Result Value   Glucose, Bld 106 (*)    All other components within normal limits  CBC  URINALYSIS, COMPLETE (UACMP) WITH MICROSCOPIC  CBG MONITORING, ED  POC URINE PREG, ED  TROPONIN I (HIGH SENSITIVITY)   ____________________________________________   EKG  Interpreted by me Normal sinus rhythm rate of 75, normal axis and intervals.  Normal QRS ST segments and T waves.  ____________________________________________    RADIOLOGY  No results found.  ____________________________________________   PROCEDURES Procedures  ____________________________________________    CLINICAL IMPRESSION /  ASSESSMENT AND PLAN / ED COURSE  Medications ordered in the ED: Medications  lactated ringers bolus 1,000 mL (has no administration in time range)  ondansetron (ZOFRAN) injection 4 mg (4 mg Intravenous Patient Refused/Not Given 02/25/21 2243)  venlafaxine XR (EFFEXOR-XR) 24 hr capsule 37.5 mg (has no administration in time range)    Pertinent labs & imaging results that were available during my care of the patient were reviewed by me and considered in my medical decision making (see chart for details).  FALLEN CRISOSTOMO was evaluated in Emergency Department on 02/25/2021 for the symptoms described in the history of present illness. She was evaluated in the context of the global COVID-19 pandemic, which necessitated consideration that the patient might be at risk for infection with the SARS-CoV-2 virus that causes COVID-19. Institutional protocols and algorithms that  pertain to the evaluation of patients at risk for COVID-19 are in a state of rapid change based on information released by regulatory bodies including the CDC and federal and state organizations. These policies and algorithms were followed during the patient's care in the ED.   Patient presents with dizziness with standing for the past 5 days.  Appears to have poor diet, inadequate fluid intake.  This is also in the setting of running out of her venlafaxine 5 days ago raising possibility of withdrawal syndrome.  She is tolerating oral intake, neurologically intact, vital signs unremarkable, lab panel normal.  Will give IV fluids, restart Effexor, follow-up with PCP.  Doubt stroke, intracranial hemorrhage, meningitis or encephalitis, elevated ICP, ACS, PE, dissection, infection.      ____________________________________________   FINAL CLINICAL IMPRESSION(S) / ED DIAGNOSES    Final diagnoses:  Dizziness  Dehydration     ED Discharge Orders         Ordered    atorvastatin (LIPITOR) 20 MG tablet  Daily        02/25/21 2248     venlafaxine XR (EFFEXOR XR) 37.5 MG 24 hr capsule  Daily with breakfast        02/25/21 2248          Portions of this note were generated with dragon dictation software. Dictation errors may occur despite best attempts at proofreading.   Sharman Cheek, MD 02/25/21 2253

## 2021-02-25 NOTE — ED Notes (Signed)
Patient laying in Doctor, general practice. Patient's daughter bedside. Patient states that she started feeling dizzy with walking since Saturday. Patient states she ran out of her menopause and cholesterol medicine and has been feeling bad since.

## 2021-02-25 NOTE — ED Triage Notes (Signed)
Pt to ED via POV with c/o dizziness and numbness from top of her head to bottom of her feet x 4-5 days. Pt states ran out of her menopause medications and cholesterol medications. Pt states took BP at home today and her BP was 176/100.   Pt A&O x4, able to speak in full and complete sentences. Pt states dizziness worse when turning her head side to side also states worse when she is walking to the store.

## 2021-02-25 NOTE — ED Notes (Addendum)
Pt sitting in lobby eating meal. No distress noted at this time.

## 2021-02-25 NOTE — ED Notes (Signed)
Patient refused discharge VS due to "their Tracey Fisher is out front and she needed to leave". Patient had already taken monitor and BP cuff off prior to DC.

## 2023-10-11 ENCOUNTER — Other Ambulatory Visit: Payer: Self-pay

## 2023-10-11 ENCOUNTER — Emergency Department
Admission: EM | Admit: 2023-10-11 | Discharge: 2023-10-11 | Disposition: A | Discharge status: Home / Self Care | Payer: Medicaid Other | Attending: Emergency Medicine | Admitting: Emergency Medicine

## 2023-10-11 DIAGNOSIS — K047 Periapical abscess without sinus: Secondary | ICD-10-CM | POA: Insufficient documentation

## 2023-10-11 DIAGNOSIS — R1084 Generalized abdominal pain: Secondary | ICD-10-CM | POA: Insufficient documentation

## 2023-10-11 DIAGNOSIS — E876 Hypokalemia: Secondary | ICD-10-CM | POA: Insufficient documentation

## 2023-10-11 DIAGNOSIS — R112 Nausea with vomiting, unspecified: Secondary | ICD-10-CM | POA: Diagnosis not present

## 2023-10-11 LAB — BASIC METABOLIC PANEL
Anion gap: 14 (ref 5–15)
BUN: 9 mg/dL (ref 6–20)
CO2: 27 mmol/L (ref 22–32)
Calcium: 9.2 mg/dL (ref 8.9–10.3)
Chloride: 95 mmol/L — ABNORMAL LOW (ref 98–111)
Creatinine, Ser: 0.62 mg/dL (ref 0.44–1.00)
GFR, Estimated: >60 mL/min (ref >60)
Glucose, Bld: 109 mg/dL — ABNORMAL HIGH (ref 70–99)
Potassium: 2.6 mmol/L — CL (ref 3.5–5.1)
Sodium: 136 mmol/L (ref 135–145)

## 2023-10-11 LAB — LIPASE, BLOOD: Lipase: 21 U/L (ref 11–51)

## 2023-10-11 LAB — CBC
HCT: 42.7 % (ref 36.0–46.0)
Hemoglobin: 14.1 g/dL (ref 12.0–15.0)
MCH: 28 pg (ref 26.0–34.0)
MCHC: 33 g/dL (ref 30.0–36.0)
MCV: 84.9 fL (ref 80.0–100.0)
Platelets: 273 10*3/uL (ref 150–400)
RBC: 5.03 MIL/uL (ref 3.87–5.11)
RDW: 15.3 % (ref 11.5–15.5)
WBC: 12 10*3/uL — ABNORMAL HIGH (ref 4.0–10.5)
nRBC: 0 % (ref 0.0–0.2)

## 2023-10-11 MED ORDER — PANTOPRAZOLE SODIUM 40 MG PO TBEC
40.0000 mg | DELAYED_RELEASE_TABLET | Freq: Once | ORAL | Status: AC
  Administered 2023-10-11: 40 mg via ORAL
  Filled 2023-10-11: qty 1

## 2023-10-11 MED ORDER — SENNOSIDES-DOCUSATE SODIUM 8.6-50 MG PO TABS: 1.0000 | ORAL_TABLET | Freq: Every day | ORAL | 0 refills | Status: AC

## 2023-10-11 MED ORDER — ALUM & MAG HYDROXIDE-SIMETH 200-200-20 MG/5ML PO SUSP
30.0000 mL | Freq: Once | ORAL | Status: AC
  Administered 2023-10-11: 30 mL via ORAL
  Filled 2023-10-11: qty 30

## 2023-10-11 MED ORDER — POTASSIUM CHLORIDE CRYS ER 20 MEQ PO TBCR: 20.0000 meq | EXTENDED_RELEASE_TABLET | Freq: Two times a day (BID) | ORAL | 0 refills | Status: AC

## 2023-10-11 MED ORDER — ONDANSETRON 4 MG PO TBDP
4.0000 mg | ORAL_TABLET | Freq: Once | ORAL | Status: AC
Start: 2023-10-11 — End: 2023-10-11
  Administered 2023-10-11: 4 mg via ORAL
  Filled 2023-10-11: qty 1

## 2023-10-11 MED ORDER — POTASSIUM CHLORIDE CRYS ER 20 MEQ PO TBCR
40.0000 meq | EXTENDED_RELEASE_TABLET | Freq: Once | ORAL | Status: AC
Start: 2023-10-11 — End: 2023-10-11
  Administered 2023-10-11: 40 meq via ORAL
  Filled 2023-10-11: qty 2

## 2023-10-11 MED ORDER — ACETAMINOPHEN 500 MG PO TABS
1000.0000 mg | ORAL_TABLET | Freq: Once | ORAL | Status: AC
Start: 2023-10-11 — End: 2023-10-11
  Administered 2023-10-11: 1000 mg via ORAL
  Filled 2023-10-11: qty 2

## 2023-10-11 MED ORDER — ONDANSETRON 4 MG PO TBDP: 4.0000 mg | ORAL_TABLET | Freq: Three times a day (TID) | ORAL | 0 refills | Status: AC | PRN

## 2023-10-11 MED ORDER — POLYETHYLENE GLYCOL 3350 17 G PO PACK: 17.0000 g | PACK | Freq: Every day | ORAL | 0 refills | Status: AC

## 2023-10-11 MED ORDER — LIDOCAINE VISCOUS HCL 2 % MT SOLN
15.0000 mL | Freq: Once | OROMUCOSAL | Status: AC
  Administered 2023-10-11: 15 mL via OROMUCOSAL
  Filled 2023-10-11: qty 15

## 2023-10-11 NOTE — ED Triage Notes (Signed)
Pt presents to ED with c/o of nausea and dental pain.

## 2023-10-11 NOTE — ED Notes (Signed)
States she doesn't feel right  Informed the provider  She went back in to speak with pt

## 2023-10-11 NOTE — ED Notes (Signed)
First nurse note; Pt here via [facility] with c/o of Nausea and toothache  VSS per EMS

## 2023-10-11 NOTE — ED Provider Notes (Signed)
[facility] Emergency Department Provider Note     Event Date/Time   First MD Initiated Contact with Patient 10/11/23 1355     (approximate)   History   Nausea and Dental Pain  HPI  Tracey Fisher is a 57 y.o. female with a history of opioid use, alcohol use, and drug withdrawal presents to the ED with complaint of generalized abdominal pain that started this morning with nausea and vomiting.  Patient reports unknown LBM approximately x 1 week. Also patient is complaining of right upper tooth pain.  Patient denies having a dentist at this time.  Denies fever and chills.  Patient admits to heroin use last night and this morning.  Denies chest pain or shortness of breath.    Physical Exam   Triage Vital Signs: ED Triage Vitals  Encounter Vitals Group     BP 10/11/23 1359 138/89     Systolic BP Percentile --      Diastolic BP Percentile --      Pulse Rate 10/11/23 1359 86     Resp 10/11/23 1359 17     Temp 10/11/23 1359 97.8 F (36.6 C)     Temp src --      SpO2 10/11/23 1359 100 %     Weight 10/11/23 1405 119 lb 14.9 oz (54.4 kg)     Height 10/11/23 1405 4\' 9"  (1.448 m)     Head Circumference --      Peak Flow --      Pain Score 10/11/23 1352 10     Pain Loc --      Pain Education --      Exclude from Growth Chart --     Most recent vital signs: Vitals:   10/11/23 1359 10/11/23 1804  BP: 138/89 130/88  Pulse: 86 78  Resp: 17 16  Temp: 97.8 F (36.6 C) 98 F (36.7 C)  SpO2: 100% 100%    General: Alert and oriented. INAD. Mildly Diaphoretic  Skin:  Warm, dry and intact. No rashes or lesions noted.     Head:  NCAT.  Eyes:  PERRLA. EOMI.   Nose:   Mucosa is moist. No rhinorrhea. Throat: Oropharynx clear.  Airway is patent. Neck:   No cervical spine tenderness to palpation. Full ROM without difficulty.  CV:  Good peripheral perfusion. RRR. No peripheral edema.  RESP:  Normal effort. LCTAB. No retractions.  ABD:  No distention. Soft.   Diffuse abdominal tenderness.  No masses or organomegaly. No CVA tenderness bilaterally.  BACK:  Spinous process is midline without deformity or tenderness. MSK:   Full ROM in all joints. No swelling, deformity or tenderness.  NEURO: Cranial nerves intact. No focal deficits. Sensation and motor function intact. Gait is steady.  Other: Poor dentition.  Tenderness to upper right second bicuspid.  No device or abscess noted.  ED Results / Procedures / Treatments   Labs (all labs ordered are listed, but only abnormal results are displayed) Labs Reviewed  CBC - Abnormal; Notable for the following components:      Result Value   WBC 12.0 (*)    All other components within normal limits  BASIC METABOLIC PANEL - Abnormal; Notable for the following components:   Potassium 2.6 (*)    Chloride 95 (*)    Glucose, Bld 109 (*)    All other components within normal limits  LIPASE, BLOOD    EKG  Sinus bradycardia with sinus arrhythmia.  No results  found.  PROCEDURES:  Critical Care performed: No  Procedures   MEDICATIONS ORDERED IN ED: Medications  ondansetron (ZOFRAN-ODT) disintegrating tablet 4 mg (4 mg Oral Given 10/11/23 1517)  lidocaine (XYLOCAINE) 2 % viscous mouth solution 15 mL (15 mLs Mouth/Throat Given 10/11/23 1517)  alum & mag hydroxide-simeth (MAALOX/MYLANTA) 200-200-20 MG/5ML suspension 30 mL (30 mLs Oral Given 10/11/23 1602)  pantoprazole (PROTONIX) EC tablet 40 mg (40 mg Oral Given 10/11/23 1603)  acetaminophen (TYLENOL) tablet 1,000 mg (1,000 mg Oral Given 10/11/23 1826)  potassium chloride SA (KLOR-CON M) CR tablet 40 mEq (40 mEq Oral Given 10/11/23 1919)     IMPRESSION / MDM / ASSESSMENT AND PLAN / ED COURSE  I reviewed the triage vital signs and the nursing notes.                              Clinical Course as of 10/11/23 2010  Thu Oct 11, 2023  1940 EKG sinus bradycardia with sinus arrhythmia.  [MH]  1955 Patient reports she is feeling better she is in  stable condition for discharge home. [MH]    Clinical Course User Index [MH] Kern Reap A, PA-C   57 y.o. female presents to the emergency department for evaluation and treatment of diffuse abdominal pain with 1 vomiting episode this morning. See HPI for further details.   Differential diagnosis includes, but is not limited to gastroenteritis, dental pain due to caries, constipation, heroin use, bowel obstruction less likely but considered  Patient's presentation is most consistent with acute complicated illness / injury requiring diagnostic workup.  Patient is alert and oriented and hemodynamically stable.  Chart reviewed.  Patient is opting out of dental nerve block and is reporting that she is more here for her abdominal pain.  She admits to using heroin this morning for the pain.  Patient history is not direct. Patient is now stating she had a bowel movement this morning with vomiting after telling patient it was appropriate for a DRE. Patient is opting out of DRE and does not believe she is in withdrawal or reaction to heroin.   Patient is intermittently diaphoretic.  No improvement with initial ED treatment. Given this basic labs are obtained.  Potassium 2.6.  40 mg of KCl administered.  EKG obtained and is reassuring.  Patient reports improvement in symptoms following administration of potassium.  She is in stable condition for discharge home and is accompanied with her daughter.  Emphasis on repeat potassium draw on Monday is provided either with her primary care or return to ED.  Patient verbalized understanding.  Resource guide for adult outpatient counseling and substance abuse provided as long with a prescription for Zofran, also patient treatment and potassium tablets.  ED precautions discussed. All questions and concerns were addressed during ED visit.    FINAL CLINICAL IMPRESSION(S) / ED DIAGNOSES   Final diagnoses:  Generalized abdominal pain  Hypokalemia   Rx / DC Orders    ED Discharge Orders          Ordered    ondansetron (ZOFRAN-ODT) 4 MG disintegrating tablet  Every 8 hours PRN        10/11/23 1638    polyethylene glycol (MIRALAX) 17 g packet  Daily        10/11/23 1638    senna-docusate (SENOKOT-S) 8.6-50 MG tablet  Daily        10/11/23 1638    potassium chloride (KLOR-CON M) 20 MEQ tablet  2 times daily        10/11/23 1948            Note:  This document was prepared using Dragon voice recognition software and may include unintentional dictation errors.    Romeo Apple, [place] A, PA-C 10/11/23 2010    Concha Se, MD 10/16/23 [phone]

## 2023-10-11 NOTE — Discharge Instructions (Addendum)
Please pick up your potassium prescription at the pharmacy.   Follow-up with your primary care in 3 days for potassium recheck.  Discontinue substance use.  A resource has been provided for you for adult outpatient counseling.  Please review this education package.  Take Tylenol for pain as needed.  Follow-up with dentist.  Gargle with warm salt water as needed.  It is very important that you follow-up with your primary care for recheck labs he can also return to ED if you cannot schedule an appointment.  Take potassium pills with food or a full glass of water to reduce stomach side effects.

## 2023-10-11 NOTE — ED Notes (Signed)
See triage note Presents with dental pain and right facial swelling  States that started about 1 week ago  Developed some n/v with stomach pain today

## 2023-10-13 NOTE — Plan of Care (Signed)
[facility] Tonsillectomy/Adenoidectomy, Postoperative PEDS care plan entered in error.

## 2023-10-16 ENCOUNTER — Telehealth (HOSPITAL_COMMUNITY): Payer: Self-pay

## 2023-10-25 ENCOUNTER — Emergency Department
Admission: EM | Admit: 2023-10-25 | Discharge: 2023-10-25 | Discharge status: Against Medical Advice | Payer: Medicaid Other | Attending: Emergency Medicine | Admitting: Emergency Medicine

## 2023-10-25 DIAGNOSIS — Z5321 Procedure and treatment not carried out due to patient leaving prior to being seen by health care provider: Secondary | ICD-10-CM | POA: Diagnosis not present

## 2023-10-25 DIAGNOSIS — K5900 Constipation, unspecified: Secondary | ICD-10-CM | POA: Diagnosis not present

## 2023-10-25 DIAGNOSIS — R110 Nausea: Secondary | ICD-10-CM | POA: Diagnosis present

## 2023-10-25 NOTE — ED Triage Notes (Signed)
Arrives from [facility] via [facility]>  C/O Constipation x 2 weeks.  REcent dental abscess, since that time decreased PO intake.  Zofran 4 mg IM given.  Nausea improved.  VS wnl.

## 2024-07-18 ENCOUNTER — Other Ambulatory Visit: Payer: Self-pay

## 2024-07-18 ENCOUNTER — Emergency Department
Admission: EM | Admit: 2024-07-18 | Discharge: 2024-07-19 | Disposition: A | Discharge status: Home / Self Care | Attending: Emergency Medicine | Admitting: Emergency Medicine

## 2024-07-18 ENCOUNTER — Emergency Department

## 2024-07-18 DIAGNOSIS — R059 Cough, unspecified: Secondary | ICD-10-CM | POA: Diagnosis present

## 2024-07-18 DIAGNOSIS — J40 Bronchitis, not specified as acute or chronic: Secondary | ICD-10-CM | POA: Insufficient documentation

## 2024-07-18 DIAGNOSIS — F1193 Opioid use, unspecified with withdrawal: Secondary | ICD-10-CM | POA: Insufficient documentation

## 2024-07-18 DIAGNOSIS — D72829 Elevated white blood cell count, unspecified: Secondary | ICD-10-CM | POA: Insufficient documentation

## 2024-07-18 LAB — CBC
HCT: 48.1 % — ABNORMAL HIGH (ref 36.0–46.0)
Hemoglobin: 16.3 g/dL — ABNORMAL HIGH (ref 12.0–15.0)
MCH: 28.2 pg (ref 26.0–34.0)
MCHC: 33.9 g/dL (ref 30.0–36.0)
MCV: 83.1 fL (ref 80.0–100.0)
Platelets: 209 K/uL (ref 150–400)
RBC: 5.79 MIL/uL — ABNORMAL HIGH (ref 3.87–5.11)
RDW: 14.6 % (ref 11.5–15.5)
WBC: 12.4 K/uL — ABNORMAL HIGH (ref 4.0–10.5)
nRBC: 0 % (ref 0.0–0.2)

## 2024-07-18 LAB — COMPREHENSIVE METABOLIC PANEL WITH GFR
ALT: 20 U/L (ref 0–44)
AST: 41 U/L (ref 15–41)
Albumin: 3.9 g/dL (ref 3.5–5.0)
Alkaline Phosphatase: 89 U/L (ref 38–126)
Anion gap: 17 — ABNORMAL HIGH (ref 5–15)
BUN: 30 mg/dL — ABNORMAL HIGH (ref 6–20)
CO2: 26 mmol/L (ref 22–32)
Calcium: 9.7 mg/dL (ref 8.9–10.3)
Chloride: 97 mmol/L — ABNORMAL LOW (ref 98–111)
Creatinine, Ser: 0.77 mg/dL (ref 0.44–1.00)
GFR, Estimated: >60 mL/min (ref >60)
Glucose, Bld: 134 mg/dL — ABNORMAL HIGH (ref 70–99)
Potassium: 3.2 mmol/L — ABNORMAL LOW (ref 3.5–5.1)
Sodium: 140 mmol/L (ref 135–145)
Total Bilirubin: 0.4 mg/dL (ref 0.0–1.2)
Total Protein: 8.1 g/dL (ref 6.5–8.1)

## 2024-07-18 LAB — CBG MONITORING, ED: Glucose-Capillary: 166 mg/dL — ABNORMAL HIGH (ref 70–99)

## 2024-07-18 MED ORDER — METHADONE HCL 10 MG/ML PO CONC
40.0000 mg | Freq: Once | ORAL | Status: AC
  Administered 2024-07-19: 40 mg via ORAL
  Filled 2024-07-18: qty 5

## 2024-07-18 MED ORDER — SODIUM CHLORIDE 0.9 % IV BOLUS
1000.0000 mL | Freq: Once | INTRAVENOUS | Status: AC
  Administered 2024-07-18: 1000 mL via INTRAVENOUS

## 2024-07-18 NOTE — ED Triage Notes (Signed)
 Patient brought in via [facility] today from home with complaints of altered mental status and withdrawal symptoms. EMS reports patient has been out of her methadone  since Monday, states whoever was at the house said she started becoming confused on Wednesday. Patient able to answer questions with delayed responses.

## 2024-07-19 LAB — RESP PANEL BY RT-PCR (RSV, FLU A&B, COVID)  RVPGX2
Influenza A by PCR: NEGATIVE
Influenza B by PCR: NEGATIVE
Resp Syncytial Virus by PCR: NEGATIVE
SARS Coronavirus 2 by RT PCR: NEGATIVE

## 2024-07-19 NOTE — ED Provider Notes (Signed)
 [facility] Provider Note    Event Date/Time   First MD Initiated Contact with Patient 07/18/24 2258     (approximate)   History   No chief complaint on file.   HPI  Tracey Fisher is a 58 y.o. female   Past medical history of Polysubstance use, on methadone , has not been taking her methadone  for the last 2 days & feels like she is in withdrawal with crampy abdominal pain nausea and diarrhea feeling cold/chills.  Has also had a cough.  Wants to be checked for pneumonia.  She reports no other acute medical complaints, denies chest pain.   External Medical Documents Reviewed: Prior hospital notes      Physical Exam   Triage Vital Signs: ED Triage Vitals [07/18/24 2202]  Encounter Vitals Group     BP (!) 173/108     Girls Systolic BP Percentile      Girls Diastolic BP Percentile      Boys Systolic BP Percentile      Boys Diastolic BP Percentile      Pulse Rate (!) 123     Resp 20     Temp 98.4 F (36.9 C)     Temp Source Oral     SpO2 99 %     Weight 120 lb (54.4 kg)     Height 4' 9 (1.448 m)     Head Circumference      Peak Flow      Pain Score      Pain Loc      Pain Education      Exclude from Growth Chart     Most recent vital signs: Vitals:   07/18/24 2202 07/19/24 0215  BP: (!) 173/108 (!) 162/100  Pulse: (!) 123 83  Resp: 20 17  Temp: 98.4 F (36.9 C) 98.3 F (36.8 C)  SpO2: 99% 97%    General: Awake, no distress.  CV:  Good peripheral perfusion.  Resp:  Normal effort. Abd:  No distention.  Other:  Hypertensive and slightly tachycardic on triage.  Benign abdominal exam soft nontender nonfocal.  Clear lungs without focality or wheezing.  No hypoxemia.  Breathing comfortably on room air and responsive to my questions appropriately   ED Results / Procedures / Treatments   Labs (all labs ordered are listed, but only abnormal results are displayed) Labs Reviewed  CBC - Abnormal; Notable for the following  components:      Result Value   WBC 12.4 (*)    RBC 5.79 (*)    Hemoglobin 16.3 (*)    HCT 48.1 (*)    All other components within normal limits  COMPREHENSIVE METABOLIC PANEL WITH GFR - Abnormal; Notable for the following components:   Potassium 3.2 (*)    Chloride 97 (*)    Glucose, Bld 134 (*)    BUN 30 (*)    Anion gap 17 (*)    All other components within normal limits  CBG MONITORING, ED - Abnormal; Notable for the following components:   Glucose-Capillary 166 (*)    All other components within normal limits  RESP PANEL BY RT-PCR (RSV, FLU A&B, COVID)  [id number]  URINALYSIS, ROUTINE W REFLEX MICROSCOPIC     I ordered and reviewed the above labs they are notable for slightly elevated white blood cell count 12.4   EKG  ED ECG REPORT I, Ginnie Shams, the attending physician, personally viewed and interpreted this ECG.   Date: 07/19/2024  EKG  Time: 2213 -sinus tachycardia, mild ST depressions inferolateral leads   RADIOLOGY I independently reviewed and interpreted chest x-ray and I see no obvious focality I also reviewed radiologist's formal read.   PROCEDURES:  Critical Care performed: No  Procedures   MEDICATIONS ORDERED IN ED: Medications  sodium chloride  0.9 % bolus 1,000 mL (0 mLs Intravenous Stopped 07/19/24 0242)  methadone  (DOLOPHINE ) 10 MG/ML solution 40 mg (40 mg Oral Given 07/19/24 0018)    IMPRESSION / MDM / ASSESSMENT AND PLAN / ED COURSE  I reviewed the triage vital signs and the nursing notes.                                Patient's presentation is most consistent with acute presentation with potential threat to life or bodily function.  Differential diagnosis includes, but is not limited to, opioid withdrawal, respiratory infection, considered but less likely sepsis   The patient is on the cardiac monitor to evaluate for evidence of arrhythmia and/or significant heart rate changes.  MDM:    Is a patient with 2 chief complaints 1 is  methadone  withdrawal symptoms after not having her methadone  for a few days.  Symptoms correspond with this with her nausea and abdominal cramping slight tachycardia which all resolved after getting a single dose of methadone .  She has also had a productive cough over the last several days and is concerned for pneumonia but given no focality on lung exam nor on chest x-ray doubt this at this time.    I considered admission however given her symptom resolution mostly and no evidence of sepsis or bacterial pneumonia or respiratory distress or hypoxemia, I think that she can be managed as outpatient-anticipatory guidance given she will be discharged and return if any new or worsening symptoms and follow-up with PMD      FINAL CLINICAL IMPRESSION(S) / ED DIAGNOSES   Final diagnoses:  Methadone  withdrawal (HCC)  Bronchitis     Rx / DC Orders   ED Discharge Orders     None        Note:  This document was prepared using Dragon voice recognition software and may include unintentional dictation errors.    Cyrena Mylar, MD 07/19/24 [phone]

## 2024-07-19 NOTE — Discharge Instructions (Addendum)
 Continue to go to your methadone  clinic to take her medications as prescribed.  Your COVID influenza and RSV testing was negative today and your chest x-ray did not show any signs of bacterial pneumonia for your cough.  This means that you do not need antibiotics at this time. Thank you for choosing us  for your health care today!  Please see your primary doctor this week for a follow up appointment.   If you have any new, worsening, or unexpected symptoms call your doctor right away or come back to the emergency department for reevaluation.  It was my pleasure to care for you today.   Ginnie EDISON Cyrena, MD

## 2024-07-27 ENCOUNTER — Emergency Department
Admission: EM | Admit: 2024-07-27 | Discharge: 2024-07-27 | Disposition: A | Discharge status: Home / Self Care | Attending: Emergency Medicine | Admitting: Emergency Medicine

## 2024-07-27 ENCOUNTER — Other Ambulatory Visit: Payer: Self-pay

## 2024-07-27 DIAGNOSIS — F1123 Opioid dependence with withdrawal: Secondary | ICD-10-CM | POA: Diagnosis not present

## 2024-07-27 DIAGNOSIS — R5381 Other malaise: Secondary | ICD-10-CM | POA: Diagnosis present

## 2024-07-27 HISTORY — DX: Essential (primary) hypertension: I10

## 2024-07-27 LAB — CBC
HCT: 45.4 % (ref 36.0–46.0)
Hemoglobin: 14.3 g/dL (ref 12.0–15.0)
MCH: 27.9 pg (ref 26.0–34.0)
MCHC: 31.5 g/dL (ref 30.0–36.0)
MCV: 88.7 fL (ref 80.0–100.0)
Platelets: 267 K/uL (ref 150–400)
RBC: 5.12 MIL/uL — ABNORMAL HIGH (ref 3.87–5.11)
RDW: 14.6 % (ref 11.5–15.5)
WBC: 10.8 K/uL — ABNORMAL HIGH (ref 4.0–10.5)
nRBC: 0 % (ref 0.0–0.2)

## 2024-07-27 LAB — COMPREHENSIVE METABOLIC PANEL WITH GFR
ALT: 17 U/L (ref 0–44)
AST: 28 U/L (ref 15–41)
Albumin: 3.6 g/dL (ref 3.5–5.0)
Alkaline Phosphatase: 90 U/L (ref 38–126)
Anion gap: 10 (ref 5–15)
BUN: 8 mg/dL (ref 6–20)
CO2: 24 mmol/L (ref 22–32)
Calcium: 9.6 mg/dL (ref 8.9–10.3)
Chloride: 103 mmol/L (ref 98–111)
Creatinine, Ser: 0.63 mg/dL (ref 0.44–1.00)
GFR, Estimated: >60 mL/min (ref >60)
Glucose, Bld: 147 mg/dL — ABNORMAL HIGH (ref 70–99)
Potassium: 3.6 mmol/L (ref 3.5–5.1)
Sodium: 137 mmol/L (ref 135–145)
Total Bilirubin: 0.5 mg/dL (ref 0.0–1.2)
Total Protein: 7.6 g/dL (ref 6.5–8.1)

## 2024-07-27 LAB — ETHANOL: Alcohol, Ethyl (B): <15 mg/dL (ref <15)

## 2024-07-27 NOTE — ED Triage Notes (Addendum)
 Pt comes with c/o body aches and pain all over. Pt states she hasn't used heroin in two weeks and has been trying to go cold [place]. Pt states she does smoke marijuana also.   Pt also states SI but no plan currently. Pt states she can't eat or drink. Pt states fatigue and can't keep her head up.   Pt has strong body odor present. Pt states she hasn't showered in days.

## 2024-07-27 NOTE — ED Provider Notes (Signed)
 [facility] Provider Note    Event Date/Time   First MD Initiated Contact with Patient 07/27/24 1825     (approximate)   History   detox and SI   HPI  Tracey Fisher is a 58 y.o. female with a history of heroin use who comes ED complaining of malaise, chills, nausea, loss of appetite.  Reports that she last used heroin 2 weeks ago and is having withdrawal symptoms.  She notes that she has intermittently been acquiring methadone  on the street to help with her symptoms, but came here for additional help.  However, after being in the waiting room she now states that she would like to leave immediately.  She had reported SI in triage, but to me she denies SI HI hallucinations.  No intent to harm herself or anyone else.     Physical Exam   Triage Vital Signs: ED Triage Vitals  Encounter Vitals Group     BP 07/27/24 1719 (!) 151/110     Girls Systolic BP Percentile --      Girls Diastolic BP Percentile --      Boys Systolic BP Percentile --      Boys Diastolic BP Percentile --      Pulse Rate 07/27/24 1719 (!) 121     Resp 07/27/24 1719 18     Temp 07/27/24 1719 98 F (36.7 C)     Temp src --      SpO2 07/27/24 1719 100 %     Weight 07/27/24 1717 120 lb (54.4 kg)     Height 07/27/24 1717 4' 9 (1.448 m)     Head Circumference --      Peak Flow --      Pain Score 07/27/24 1717 10     Pain Loc --      Pain Education --      Exclude from Growth Chart --     Most recent vital signs: Vitals:   07/27/24 1719  BP: (!) 151/110  Pulse: (!) 121  Resp: 18  Temp: 98 F (36.7 C)  SpO2: 100%    General: Awake, no distress.  CV:  Good peripheral perfusion.  Resp:  Normal effort.  Abd:  No distention.  Other:  Linear thought process, normal speech and language.  No delusions or paranoia.   ED Results / Procedures / Treatments   Labs (all labs ordered are listed, but only abnormal results are displayed) Labs Reviewed  COMPREHENSIVE METABOLIC  PANEL WITH GFR - Abnormal; Notable for the following components:      Result Value   Glucose, Bld 147 (*)    All other components within normal limits  CBC - Abnormal; Notable for the following components:   WBC 10.8 (*)    RBC 5.12 (*)    All other components within normal limits  ETHANOL  URINE DRUG SCREEN, QUALITATIVE ([facility] ONLY)     RADIOLOGY    PROCEDURES:  Procedures   MEDICATIONS ORDERED IN ED: Medications - No data to display   IMPRESSION / MDM / ASSESSMENT AND PLAN / ED COURSE  I reviewed the triage vital signs and the nursing notes.                             Patient presents with mild opiate withdrawal syndrome.  Doubt infection, acute cardiopulmonary or intra-abdominal pathology.  She wishes to leave the emergency department, does not wish to  continue evaluation.  Offered medications for symptom control including antiemetics, antacids, and Suboxone , she declines.  States that she is aware of [facility] and available resources for outpatient treatment.  Declines any additional evaluation or assistance at this time.  She has medical decision-making capacity.  Will discharge AGAINST MEDICAL ADVICE.  She is not willing to wait for discharge instructions and outpatient resources.      FINAL CLINICAL IMPRESSION(S) / ED DIAGNOSES   Final diagnoses:  Opioid dependence with withdrawal (HCC)     Rx / DC Orders   ED Discharge Orders     None        Note:  This document was prepared using Dragon voice recognition software and may include unintentional dictation errors.   Viviann Pastor, MD 07/27/24 [phone]

## 2024-07-27 NOTE — ED Notes (Signed)
 Pt dressed out into hospital attire. Pt belongings to include: 1 brown blanket 2 brown boots 2 black socks 1 black dress 1 black scarf 1 yellow armband 2 gray earrings 1 yellow ring

## 2024-07-27 NOTE — ED Notes (Signed)
 This RN found a urine cup with jewelry in it (931pm on 07/27/24)with pt name sticker. Called number on file to let her know I found it. Left voicemail and will leave jewelry at front desk.

## 2024-09-10 ENCOUNTER — Other Ambulatory Visit: Payer: Self-pay

## 2024-09-10 ENCOUNTER — Emergency Department
Admission: EM | Admit: 2024-09-10 | Discharge: 2024-09-10 | Disposition: A | Discharge status: Home / Self Care | Attending: Emergency Medicine | Admitting: Emergency Medicine

## 2024-09-10 DIAGNOSIS — T391X1A Poisoning by 4-Aminophenol derivatives, accidental (unintentional), initial encounter: Secondary | ICD-10-CM | POA: Insufficient documentation

## 2024-09-10 DIAGNOSIS — F1110 Opioid abuse, uncomplicated: Secondary | ICD-10-CM | POA: Diagnosis not present

## 2024-09-10 DIAGNOSIS — T50901A Poisoning by unspecified drugs, medicaments and biological substances, accidental (unintentional), initial encounter: Secondary | ICD-10-CM

## 2024-09-10 LAB — BASIC METABOLIC PANEL WITH GFR
Anion gap: 7 (ref 5–15)
BUN: 12 mg/dL (ref 6–20)
CO2: 26 mmol/L (ref 22–32)
Calcium: 8.8 mg/dL — ABNORMAL LOW (ref 8.9–10.3)
Chloride: 105 mmol/L (ref 98–111)
Creatinine, Ser: 0.62 mg/dL (ref 0.44–1.00)
GFR, Estimated: >60 mL/min (ref >60)
Glucose, Bld: 133 mg/dL — ABNORMAL HIGH (ref 70–99)
Potassium: 4.1 mmol/L (ref 3.5–5.1)
Sodium: 138 mmol/L (ref 135–145)

## 2024-09-10 LAB — CBC
HCT: 37.1 % (ref 36.0–46.0)
Hemoglobin: 12 g/dL (ref 12.0–15.0)
MCH: 28.5 pg (ref 26.0–34.0)
MCHC: 32.3 g/dL (ref 30.0–36.0)
MCV: 88.1 fL (ref 80.0–100.0)
Platelets: 277 K/uL (ref 150–400)
RBC: 4.21 MIL/uL (ref 3.87–5.11)
RDW: 15.7 % — ABNORMAL HIGH (ref 11.5–15.5)
WBC: 13.7 K/uL — ABNORMAL HIGH (ref 4.0–10.5)
nRBC: 0 % (ref 0.0–0.2)

## 2024-09-10 MED ORDER — NALOXONE HCL 4 MG/0.1ML NA LIQD
1.0000 | Freq: Once | NASAL | Status: AC
  Administered 2024-09-10: 1 via NASAL
  Filled 2024-09-10: qty 4
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
## 2024-09-10 NOTE — ED Notes (Signed)
 CCMD contacted.
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
## 2024-09-10 NOTE — ED Notes (Signed)
 Family updated as to patient's status by patient. Phone provided.
